# Patient Record
Sex: Male | Born: 1950 | Race: White | Hispanic: No | Marital: Married | Smoking: Never smoker
Health system: Southern US, Community
[De-identification: ages and names within clinical notes are randomized; demographics above are authoritative.]

## PROBLEM LIST (undated history)

## (undated) DIAGNOSIS — G43909 Migraine, unspecified, not intractable, without status migrainosus: Secondary | ICD-10-CM

## (undated) DIAGNOSIS — R7303 Prediabetes: Secondary | ICD-10-CM

## (undated) DIAGNOSIS — I10 Essential (primary) hypertension: Secondary | ICD-10-CM

## (undated) DIAGNOSIS — K5792 Diverticulitis of intestine, part unspecified, without perforation or abscess without bleeding: Secondary | ICD-10-CM

## (undated) HISTORY — DX: Migraine, unspecified, not intractable, without status migrainosus: G43.909

## (undated) HISTORY — DX: Diverticulitis of intestine, part unspecified, without perforation or abscess without bleeding: K57.92

## (undated) HISTORY — DX: Essential (primary) hypertension: I10

## (undated) HISTORY — DX: Prediabetes: R73.03

---

## 1992-07-21 HISTORY — PX: APPENDECTOMY: SHX54

## 2003-07-22 HISTORY — PX: BACK SURGERY: SHX140

## 2014-06-21 ENCOUNTER — Encounter (INDEPENDENT_AMBULATORY_CARE_PROVIDER_SITE_OTHER): Payer: Self-pay | Admitting: Ophthalmology

## 2014-06-22 ENCOUNTER — Encounter (INDEPENDENT_AMBULATORY_CARE_PROVIDER_SITE_OTHER): Payer: Managed Care, Other (non HMO) | Admitting: Ophthalmology

## 2014-06-22 DIAGNOSIS — H3532 Exudative age-related macular degeneration: Secondary | ICD-10-CM

## 2014-06-22 DIAGNOSIS — H43813 Vitreous degeneration, bilateral: Secondary | ICD-10-CM

## 2014-06-22 DIAGNOSIS — H2513 Age-related nuclear cataract, bilateral: Secondary | ICD-10-CM

## 2014-06-22 DIAGNOSIS — H3531 Nonexudative age-related macular degeneration: Secondary | ICD-10-CM

## 2014-06-22 DIAGNOSIS — H35033 Hypertensive retinopathy, bilateral: Secondary | ICD-10-CM

## 2014-06-22 DIAGNOSIS — I10 Essential (primary) hypertension: Secondary | ICD-10-CM

## 2014-07-24 ENCOUNTER — Encounter (INDEPENDENT_AMBULATORY_CARE_PROVIDER_SITE_OTHER): Payer: Managed Care, Other (non HMO) | Admitting: Ophthalmology

## 2014-07-24 DIAGNOSIS — H2513 Age-related nuclear cataract, bilateral: Secondary | ICD-10-CM

## 2014-07-24 DIAGNOSIS — H3532 Exudative age-related macular degeneration: Secondary | ICD-10-CM

## 2014-07-24 DIAGNOSIS — H43813 Vitreous degeneration, bilateral: Secondary | ICD-10-CM

## 2014-07-24 DIAGNOSIS — I10 Essential (primary) hypertension: Secondary | ICD-10-CM

## 2014-07-24 DIAGNOSIS — H3531 Nonexudative age-related macular degeneration: Secondary | ICD-10-CM

## 2014-07-24 DIAGNOSIS — H35033 Hypertensive retinopathy, bilateral: Secondary | ICD-10-CM

## 2014-08-21 ENCOUNTER — Encounter (INDEPENDENT_AMBULATORY_CARE_PROVIDER_SITE_OTHER): Payer: Managed Care, Other (non HMO) | Admitting: Ophthalmology

## 2014-08-21 DIAGNOSIS — H3531 Nonexudative age-related macular degeneration: Secondary | ICD-10-CM

## 2014-08-21 DIAGNOSIS — I10 Essential (primary) hypertension: Secondary | ICD-10-CM

## 2014-08-21 DIAGNOSIS — H2513 Age-related nuclear cataract, bilateral: Secondary | ICD-10-CM

## 2014-08-21 DIAGNOSIS — H35033 Hypertensive retinopathy, bilateral: Secondary | ICD-10-CM

## 2014-08-21 DIAGNOSIS — H3532 Exudative age-related macular degeneration: Secondary | ICD-10-CM

## 2014-08-21 DIAGNOSIS — H43813 Vitreous degeneration, bilateral: Secondary | ICD-10-CM

## 2014-09-19 ENCOUNTER — Encounter (INDEPENDENT_AMBULATORY_CARE_PROVIDER_SITE_OTHER): Payer: Managed Care, Other (non HMO) | Admitting: Ophthalmology

## 2014-09-19 DIAGNOSIS — H35033 Hypertensive retinopathy, bilateral: Secondary | ICD-10-CM | POA: Diagnosis not present

## 2014-09-19 DIAGNOSIS — H3531 Nonexudative age-related macular degeneration: Secondary | ICD-10-CM | POA: Diagnosis not present

## 2014-09-19 DIAGNOSIS — H43813 Vitreous degeneration, bilateral: Secondary | ICD-10-CM | POA: Diagnosis not present

## 2014-09-19 DIAGNOSIS — H3532 Exudative age-related macular degeneration: Secondary | ICD-10-CM | POA: Diagnosis not present

## 2014-09-19 DIAGNOSIS — I10 Essential (primary) hypertension: Secondary | ICD-10-CM | POA: Diagnosis not present

## 2014-10-23 ENCOUNTER — Encounter (INDEPENDENT_AMBULATORY_CARE_PROVIDER_SITE_OTHER): Payer: Managed Care, Other (non HMO) | Admitting: Ophthalmology

## 2014-10-23 DIAGNOSIS — H3531 Nonexudative age-related macular degeneration: Secondary | ICD-10-CM | POA: Diagnosis not present

## 2014-10-23 DIAGNOSIS — H35033 Hypertensive retinopathy, bilateral: Secondary | ICD-10-CM

## 2014-10-23 DIAGNOSIS — H3532 Exudative age-related macular degeneration: Secondary | ICD-10-CM

## 2014-10-23 DIAGNOSIS — I10 Essential (primary) hypertension: Secondary | ICD-10-CM | POA: Diagnosis not present

## 2014-10-23 DIAGNOSIS — H2513 Age-related nuclear cataract, bilateral: Secondary | ICD-10-CM

## 2014-10-23 DIAGNOSIS — H43813 Vitreous degeneration, bilateral: Secondary | ICD-10-CM | POA: Diagnosis not present

## 2014-10-24 ENCOUNTER — Encounter (INDEPENDENT_AMBULATORY_CARE_PROVIDER_SITE_OTHER): Payer: Managed Care, Other (non HMO) | Admitting: Ophthalmology

## 2014-11-28 ENCOUNTER — Encounter (INDEPENDENT_AMBULATORY_CARE_PROVIDER_SITE_OTHER): Payer: Managed Care, Other (non HMO) | Admitting: Ophthalmology

## 2014-11-28 DIAGNOSIS — H3532 Exudative age-related macular degeneration: Secondary | ICD-10-CM

## 2014-11-28 DIAGNOSIS — H35033 Hypertensive retinopathy, bilateral: Secondary | ICD-10-CM | POA: Diagnosis not present

## 2014-11-28 DIAGNOSIS — H3531 Nonexudative age-related macular degeneration: Secondary | ICD-10-CM

## 2014-11-28 DIAGNOSIS — I10 Essential (primary) hypertension: Secondary | ICD-10-CM

## 2014-11-28 DIAGNOSIS — H2513 Age-related nuclear cataract, bilateral: Secondary | ICD-10-CM

## 2014-11-28 DIAGNOSIS — H43813 Vitreous degeneration, bilateral: Secondary | ICD-10-CM | POA: Diagnosis not present

## 2015-01-02 ENCOUNTER — Encounter (INDEPENDENT_AMBULATORY_CARE_PROVIDER_SITE_OTHER): Payer: Managed Care, Other (non HMO) | Admitting: Ophthalmology

## 2015-01-04 ENCOUNTER — Encounter (INDEPENDENT_AMBULATORY_CARE_PROVIDER_SITE_OTHER): Payer: Managed Care, Other (non HMO) | Admitting: Ophthalmology

## 2015-01-04 DIAGNOSIS — H43813 Vitreous degeneration, bilateral: Secondary | ICD-10-CM | POA: Diagnosis not present

## 2015-01-04 DIAGNOSIS — I10 Essential (primary) hypertension: Secondary | ICD-10-CM | POA: Diagnosis not present

## 2015-01-04 DIAGNOSIS — H3531 Nonexudative age-related macular degeneration: Secondary | ICD-10-CM | POA: Diagnosis not present

## 2015-01-04 DIAGNOSIS — H2513 Age-related nuclear cataract, bilateral: Secondary | ICD-10-CM | POA: Diagnosis not present

## 2015-01-04 DIAGNOSIS — H35033 Hypertensive retinopathy, bilateral: Secondary | ICD-10-CM

## 2015-01-04 DIAGNOSIS — H3532 Exudative age-related macular degeneration: Secondary | ICD-10-CM

## 2015-02-13 ENCOUNTER — Encounter (INDEPENDENT_AMBULATORY_CARE_PROVIDER_SITE_OTHER): Payer: Managed Care, Other (non HMO) | Admitting: Ophthalmology

## 2015-02-13 DIAGNOSIS — H3532 Exudative age-related macular degeneration: Secondary | ICD-10-CM

## 2015-02-13 DIAGNOSIS — H35033 Hypertensive retinopathy, bilateral: Secondary | ICD-10-CM | POA: Diagnosis not present

## 2015-02-13 DIAGNOSIS — I10 Essential (primary) hypertension: Secondary | ICD-10-CM

## 2015-02-13 DIAGNOSIS — H43813 Vitreous degeneration, bilateral: Secondary | ICD-10-CM | POA: Diagnosis not present

## 2015-02-13 DIAGNOSIS — H3531 Nonexudative age-related macular degeneration: Secondary | ICD-10-CM | POA: Diagnosis not present

## 2015-03-27 ENCOUNTER — Encounter (INDEPENDENT_AMBULATORY_CARE_PROVIDER_SITE_OTHER): Payer: Managed Care, Other (non HMO) | Admitting: Ophthalmology

## 2015-03-27 DIAGNOSIS — H3532 Exudative age-related macular degeneration: Secondary | ICD-10-CM | POA: Diagnosis not present

## 2015-03-27 DIAGNOSIS — I10 Essential (primary) hypertension: Secondary | ICD-10-CM | POA: Diagnosis not present

## 2015-03-27 DIAGNOSIS — H3531 Nonexudative age-related macular degeneration: Secondary | ICD-10-CM | POA: Diagnosis not present

## 2015-03-27 DIAGNOSIS — H35033 Hypertensive retinopathy, bilateral: Secondary | ICD-10-CM | POA: Diagnosis not present

## 2015-03-27 DIAGNOSIS — H43813 Vitreous degeneration, bilateral: Secondary | ICD-10-CM

## 2015-03-27 DIAGNOSIS — H2513 Age-related nuclear cataract, bilateral: Secondary | ICD-10-CM | POA: Diagnosis not present

## 2015-05-15 ENCOUNTER — Encounter (INDEPENDENT_AMBULATORY_CARE_PROVIDER_SITE_OTHER): Payer: Managed Care, Other (non HMO) | Admitting: Ophthalmology

## 2015-05-15 DIAGNOSIS — H353211 Exudative age-related macular degeneration, right eye, with active choroidal neovascularization: Secondary | ICD-10-CM

## 2015-05-15 DIAGNOSIS — H353121 Nonexudative age-related macular degeneration, left eye, early dry stage: Secondary | ICD-10-CM

## 2015-05-15 DIAGNOSIS — H35033 Hypertensive retinopathy, bilateral: Secondary | ICD-10-CM

## 2015-05-15 DIAGNOSIS — H43813 Vitreous degeneration, bilateral: Secondary | ICD-10-CM

## 2015-05-15 DIAGNOSIS — I10 Essential (primary) hypertension: Secondary | ICD-10-CM

## 2015-07-03 ENCOUNTER — Encounter (INDEPENDENT_AMBULATORY_CARE_PROVIDER_SITE_OTHER): Payer: Managed Care, Other (non HMO) | Admitting: Ophthalmology

## 2015-07-03 DIAGNOSIS — H353211 Exudative age-related macular degeneration, right eye, with active choroidal neovascularization: Secondary | ICD-10-CM

## 2015-07-03 DIAGNOSIS — H353121 Nonexudative age-related macular degeneration, left eye, early dry stage: Secondary | ICD-10-CM | POA: Diagnosis not present

## 2015-07-03 DIAGNOSIS — H35033 Hypertensive retinopathy, bilateral: Secondary | ICD-10-CM | POA: Diagnosis not present

## 2015-07-03 DIAGNOSIS — H43813 Vitreous degeneration, bilateral: Secondary | ICD-10-CM | POA: Diagnosis not present

## 2015-07-03 DIAGNOSIS — I10 Essential (primary) hypertension: Secondary | ICD-10-CM

## 2015-07-03 DIAGNOSIS — H2513 Age-related nuclear cataract, bilateral: Secondary | ICD-10-CM

## 2015-08-21 ENCOUNTER — Encounter (INDEPENDENT_AMBULATORY_CARE_PROVIDER_SITE_OTHER): Payer: BLUE CROSS/BLUE SHIELD | Admitting: Ophthalmology

## 2015-08-21 DIAGNOSIS — H35033 Hypertensive retinopathy, bilateral: Secondary | ICD-10-CM

## 2015-08-21 DIAGNOSIS — H353211 Exudative age-related macular degeneration, right eye, with active choroidal neovascularization: Secondary | ICD-10-CM

## 2015-08-21 DIAGNOSIS — H43813 Vitreous degeneration, bilateral: Secondary | ICD-10-CM

## 2015-08-21 DIAGNOSIS — I10 Essential (primary) hypertension: Secondary | ICD-10-CM

## 2015-08-21 DIAGNOSIS — H353121 Nonexudative age-related macular degeneration, left eye, early dry stage: Secondary | ICD-10-CM | POA: Diagnosis not present

## 2015-10-16 ENCOUNTER — Encounter (INDEPENDENT_AMBULATORY_CARE_PROVIDER_SITE_OTHER): Payer: BLUE CROSS/BLUE SHIELD | Admitting: Ophthalmology

## 2015-10-16 DIAGNOSIS — H2513 Age-related nuclear cataract, bilateral: Secondary | ICD-10-CM

## 2015-10-16 DIAGNOSIS — H353121 Nonexudative age-related macular degeneration, left eye, early dry stage: Secondary | ICD-10-CM | POA: Diagnosis not present

## 2015-10-16 DIAGNOSIS — H353211 Exudative age-related macular degeneration, right eye, with active choroidal neovascularization: Secondary | ICD-10-CM | POA: Diagnosis not present

## 2015-10-16 DIAGNOSIS — I10 Essential (primary) hypertension: Secondary | ICD-10-CM | POA: Diagnosis not present

## 2015-10-16 DIAGNOSIS — H43813 Vitreous degeneration, bilateral: Secondary | ICD-10-CM | POA: Diagnosis not present

## 2015-10-16 DIAGNOSIS — H35033 Hypertensive retinopathy, bilateral: Secondary | ICD-10-CM | POA: Diagnosis not present

## 2015-12-18 ENCOUNTER — Encounter (INDEPENDENT_AMBULATORY_CARE_PROVIDER_SITE_OTHER): Payer: BLUE CROSS/BLUE SHIELD | Admitting: Ophthalmology

## 2015-12-18 DIAGNOSIS — H2513 Age-related nuclear cataract, bilateral: Secondary | ICD-10-CM

## 2015-12-18 DIAGNOSIS — H35033 Hypertensive retinopathy, bilateral: Secondary | ICD-10-CM | POA: Diagnosis not present

## 2015-12-18 DIAGNOSIS — H43813 Vitreous degeneration, bilateral: Secondary | ICD-10-CM

## 2015-12-18 DIAGNOSIS — H353121 Nonexudative age-related macular degeneration, left eye, early dry stage: Secondary | ICD-10-CM

## 2015-12-18 DIAGNOSIS — I10 Essential (primary) hypertension: Secondary | ICD-10-CM

## 2015-12-18 DIAGNOSIS — H353211 Exudative age-related macular degeneration, right eye, with active choroidal neovascularization: Secondary | ICD-10-CM

## 2016-02-26 ENCOUNTER — Encounter (INDEPENDENT_AMBULATORY_CARE_PROVIDER_SITE_OTHER): Payer: BLUE CROSS/BLUE SHIELD | Admitting: Ophthalmology

## 2016-02-26 DIAGNOSIS — H35033 Hypertensive retinopathy, bilateral: Secondary | ICD-10-CM | POA: Diagnosis not present

## 2016-02-26 DIAGNOSIS — H43813 Vitreous degeneration, bilateral: Secondary | ICD-10-CM | POA: Diagnosis not present

## 2016-02-26 DIAGNOSIS — H353121 Nonexudative age-related macular degeneration, left eye, early dry stage: Secondary | ICD-10-CM | POA: Diagnosis not present

## 2016-02-26 DIAGNOSIS — H353211 Exudative age-related macular degeneration, right eye, with active choroidal neovascularization: Secondary | ICD-10-CM | POA: Diagnosis not present

## 2016-02-26 DIAGNOSIS — I10 Essential (primary) hypertension: Secondary | ICD-10-CM

## 2016-05-20 ENCOUNTER — Encounter (INDEPENDENT_AMBULATORY_CARE_PROVIDER_SITE_OTHER): Payer: BLUE CROSS/BLUE SHIELD | Admitting: Ophthalmology

## 2016-05-20 DIAGNOSIS — I10 Essential (primary) hypertension: Secondary | ICD-10-CM | POA: Diagnosis not present

## 2016-05-20 DIAGNOSIS — H35033 Hypertensive retinopathy, bilateral: Secondary | ICD-10-CM | POA: Diagnosis not present

## 2016-05-20 DIAGNOSIS — H353211 Exudative age-related macular degeneration, right eye, with active choroidal neovascularization: Secondary | ICD-10-CM

## 2016-05-20 DIAGNOSIS — H2513 Age-related nuclear cataract, bilateral: Secondary | ICD-10-CM

## 2016-05-20 DIAGNOSIS — H353121 Nonexudative age-related macular degeneration, left eye, early dry stage: Secondary | ICD-10-CM

## 2016-05-20 DIAGNOSIS — H43813 Vitreous degeneration, bilateral: Secondary | ICD-10-CM

## 2016-08-26 ENCOUNTER — Encounter (INDEPENDENT_AMBULATORY_CARE_PROVIDER_SITE_OTHER): Payer: BLUE CROSS/BLUE SHIELD | Admitting: Ophthalmology

## 2016-08-26 DIAGNOSIS — H353121 Nonexudative age-related macular degeneration, left eye, early dry stage: Secondary | ICD-10-CM

## 2016-08-26 DIAGNOSIS — H35033 Hypertensive retinopathy, bilateral: Secondary | ICD-10-CM | POA: Diagnosis not present

## 2016-08-26 DIAGNOSIS — H353211 Exudative age-related macular degeneration, right eye, with active choroidal neovascularization: Secondary | ICD-10-CM | POA: Diagnosis not present

## 2016-08-26 DIAGNOSIS — I10 Essential (primary) hypertension: Secondary | ICD-10-CM | POA: Diagnosis not present

## 2016-08-26 DIAGNOSIS — H43813 Vitreous degeneration, bilateral: Secondary | ICD-10-CM

## 2016-12-02 ENCOUNTER — Encounter (INDEPENDENT_AMBULATORY_CARE_PROVIDER_SITE_OTHER): Payer: BLUE CROSS/BLUE SHIELD | Admitting: Ophthalmology

## 2016-12-10 ENCOUNTER — Encounter (INDEPENDENT_AMBULATORY_CARE_PROVIDER_SITE_OTHER): Payer: BLUE CROSS/BLUE SHIELD | Admitting: Ophthalmology

## 2016-12-10 DIAGNOSIS — H353211 Exudative age-related macular degeneration, right eye, with active choroidal neovascularization: Secondary | ICD-10-CM

## 2016-12-10 DIAGNOSIS — H43813 Vitreous degeneration, bilateral: Secondary | ICD-10-CM | POA: Diagnosis not present

## 2016-12-10 DIAGNOSIS — H2513 Age-related nuclear cataract, bilateral: Secondary | ICD-10-CM

## 2016-12-10 DIAGNOSIS — H35033 Hypertensive retinopathy, bilateral: Secondary | ICD-10-CM | POA: Diagnosis not present

## 2016-12-10 DIAGNOSIS — I10 Essential (primary) hypertension: Secondary | ICD-10-CM | POA: Diagnosis not present

## 2016-12-10 DIAGNOSIS — H53121 Transient visual loss, right eye: Secondary | ICD-10-CM

## 2017-03-17 ENCOUNTER — Encounter (INDEPENDENT_AMBULATORY_CARE_PROVIDER_SITE_OTHER): Payer: BLUE CROSS/BLUE SHIELD | Admitting: Ophthalmology

## 2017-03-24 ENCOUNTER — Encounter (INDEPENDENT_AMBULATORY_CARE_PROVIDER_SITE_OTHER): Payer: BLUE CROSS/BLUE SHIELD | Admitting: Ophthalmology

## 2017-03-24 DIAGNOSIS — H353211 Exudative age-related macular degeneration, right eye, with active choroidal neovascularization: Secondary | ICD-10-CM | POA: Diagnosis not present

## 2017-03-24 DIAGNOSIS — I10 Essential (primary) hypertension: Secondary | ICD-10-CM | POA: Diagnosis not present

## 2017-03-24 DIAGNOSIS — H35033 Hypertensive retinopathy, bilateral: Secondary | ICD-10-CM

## 2017-03-24 DIAGNOSIS — H353121 Nonexudative age-related macular degeneration, left eye, early dry stage: Secondary | ICD-10-CM

## 2017-03-24 DIAGNOSIS — H43813 Vitreous degeneration, bilateral: Secondary | ICD-10-CM

## 2017-06-26 ENCOUNTER — Encounter (INDEPENDENT_AMBULATORY_CARE_PROVIDER_SITE_OTHER): Payer: BLUE CROSS/BLUE SHIELD | Admitting: Ophthalmology

## 2017-06-26 DIAGNOSIS — H2513 Age-related nuclear cataract, bilateral: Secondary | ICD-10-CM | POA: Diagnosis not present

## 2017-06-26 DIAGNOSIS — I10 Essential (primary) hypertension: Secondary | ICD-10-CM

## 2017-06-26 DIAGNOSIS — H35033 Hypertensive retinopathy, bilateral: Secondary | ICD-10-CM | POA: Diagnosis not present

## 2017-06-26 DIAGNOSIS — H43813 Vitreous degeneration, bilateral: Secondary | ICD-10-CM

## 2017-06-26 DIAGNOSIS — H353121 Nonexudative age-related macular degeneration, left eye, early dry stage: Secondary | ICD-10-CM

## 2017-06-26 DIAGNOSIS — H353211 Exudative age-related macular degeneration, right eye, with active choroidal neovascularization: Secondary | ICD-10-CM

## 2017-06-30 ENCOUNTER — Encounter (INDEPENDENT_AMBULATORY_CARE_PROVIDER_SITE_OTHER): Payer: BLUE CROSS/BLUE SHIELD | Admitting: Ophthalmology

## 2017-10-13 ENCOUNTER — Encounter (INDEPENDENT_AMBULATORY_CARE_PROVIDER_SITE_OTHER): Payer: BLUE CROSS/BLUE SHIELD | Admitting: Ophthalmology

## 2017-10-13 DIAGNOSIS — H353121 Nonexudative age-related macular degeneration, left eye, early dry stage: Secondary | ICD-10-CM

## 2017-10-13 DIAGNOSIS — H353211 Exudative age-related macular degeneration, right eye, with active choroidal neovascularization: Secondary | ICD-10-CM

## 2017-10-13 DIAGNOSIS — I10 Essential (primary) hypertension: Secondary | ICD-10-CM

## 2017-10-13 DIAGNOSIS — H43813 Vitreous degeneration, bilateral: Secondary | ICD-10-CM | POA: Diagnosis not present

## 2017-10-13 DIAGNOSIS — H2513 Age-related nuclear cataract, bilateral: Secondary | ICD-10-CM

## 2017-10-13 DIAGNOSIS — H35033 Hypertensive retinopathy, bilateral: Secondary | ICD-10-CM | POA: Diagnosis not present

## 2018-02-02 ENCOUNTER — Encounter (INDEPENDENT_AMBULATORY_CARE_PROVIDER_SITE_OTHER): Payer: BLUE CROSS/BLUE SHIELD | Admitting: Ophthalmology

## 2018-02-02 DIAGNOSIS — H353211 Exudative age-related macular degeneration, right eye, with active choroidal neovascularization: Secondary | ICD-10-CM

## 2018-02-02 DIAGNOSIS — I10 Essential (primary) hypertension: Secondary | ICD-10-CM

## 2018-02-02 DIAGNOSIS — H353121 Nonexudative age-related macular degeneration, left eye, early dry stage: Secondary | ICD-10-CM

## 2018-02-02 DIAGNOSIS — H43813 Vitreous degeneration, bilateral: Secondary | ICD-10-CM

## 2018-02-02 DIAGNOSIS — H2513 Age-related nuclear cataract, bilateral: Secondary | ICD-10-CM

## 2018-02-02 DIAGNOSIS — H35033 Hypertensive retinopathy, bilateral: Secondary | ICD-10-CM

## 2018-05-10 DIAGNOSIS — Z23 Encounter for immunization: Secondary | ICD-10-CM | POA: Diagnosis not present

## 2018-06-01 ENCOUNTER — Encounter (INDEPENDENT_AMBULATORY_CARE_PROVIDER_SITE_OTHER): Payer: Medicare Other | Admitting: Ophthalmology

## 2018-06-01 DIAGNOSIS — H2513 Age-related nuclear cataract, bilateral: Secondary | ICD-10-CM | POA: Diagnosis not present

## 2018-06-01 DIAGNOSIS — H353112 Nonexudative age-related macular degeneration, right eye, intermediate dry stage: Secondary | ICD-10-CM | POA: Diagnosis not present

## 2018-06-01 DIAGNOSIS — I10 Essential (primary) hypertension: Secondary | ICD-10-CM | POA: Diagnosis not present

## 2018-06-01 DIAGNOSIS — H43813 Vitreous degeneration, bilateral: Secondary | ICD-10-CM | POA: Diagnosis not present

## 2018-06-01 DIAGNOSIS — H35033 Hypertensive retinopathy, bilateral: Secondary | ICD-10-CM | POA: Diagnosis not present

## 2018-06-01 DIAGNOSIS — H353211 Exudative age-related macular degeneration, right eye, with active choroidal neovascularization: Secondary | ICD-10-CM | POA: Diagnosis not present

## 2018-09-08 ENCOUNTER — Telehealth: Payer: Self-pay | Admitting: *Deleted

## 2018-09-08 NOTE — Telephone Encounter (Signed)
Received Medical records from [facility]; forwarded to provider/SLS 02/19

## 2018-09-10 ENCOUNTER — Encounter: Payer: Self-pay | Admitting: Family Medicine

## 2018-09-10 ENCOUNTER — Ambulatory Visit (INDEPENDENT_AMBULATORY_CARE_PROVIDER_SITE_OTHER): Payer: Medicare Other | Admitting: Family Medicine

## 2018-09-10 VITALS — BP 150/90 | HR 94 | Temp 98.5°F | Ht 68.0 in | Wt 230.4 lb

## 2018-09-10 DIAGNOSIS — Z23 Encounter for immunization: Secondary | ICD-10-CM

## 2018-09-10 DIAGNOSIS — I10 Essential (primary) hypertension: Secondary | ICD-10-CM | POA: Insufficient documentation

## 2018-09-10 MED ORDER — LOSARTAN POTASSIUM-HCTZ 100-25 MG PO TABS: 1.0000 | ORAL_TABLET | Freq: Every day | ORAL | 1 refills | Status: DC

## 2018-09-10 NOTE — Progress Notes (Signed)
Chief Complaint  Patient presents with  . New Patient (Initial Visit)       New Patient Visit SUBJECTIVE: HPI: Joseph Mayer is an 68 y.o.male who is being seen for establishing care.  The patient was previously seen at office in [place].  Hypertension Patient presents for hypertension follow up. He does monitor home blood pressures. Blood pressures ranging on average from 120's/70's. He is compliant with medications (normally)- Hyzaar 100-25 mg/d. Patient has these side effects of medication: none He is adhering to a healthy diet overall. Exercise: some walking, lifting weights  No Known Allergies  Past Medical History:  Diagnosis Date  . Diverticulitis   . Hypertension   . Migraine    Past Surgical History:  Procedure Laterality Date  . APPENDECTOMY  1994  . BACK SURGERY  2005   Family History  Problem Relation Age of Onset  . Diabetes Mother   . Early death Mother   . Heart disease Mother   . Stroke Mother   . Early death Maternal Grandmother   . Arthritis Maternal Grandfather        RA  . Early death Paternal Grandfather    No Known Allergies  Current Outpatient Medications:  .  losartan-hydrochlorothiazide (HYZAAR) 100-25 MG tablet, Take 1 tablet by mouth daily., Disp: 90 tablet, Rfl: 1 .  Multiple Vitamins-Minerals (PRESERVISION AREDS 2 PO), Take by mouth., Disp: , Rfl:    ROS Cardiovascular: Denies chest pain  Respiratory: Denies dyspnea   OBJECTIVE: BP (!) 150/90 (BP Location: Left Arm, Patient Position: Sitting, Cuff Size: Large)   Pulse 94   Temp 98.5 F (36.9 C) (Oral)   Ht 5\' 8"  (1.727 m)   Wt 230 lb 6 oz (104.5 kg)   SpO2 96%   BMI 35.03 kg/m   Constitutional: -  VS reviewed -  Well developed, well nourished, appears stated age -  No apparent distress  Psychiatric: -  Oriented to person, place, and time -  Memory intact -  Affect and mood normal -  Fluent conversation, good eye contact -  Judgment and insight age appropriate   Eye: -  Conjunctivae clear, no discharge -  Pupils symmetric, round, reactive to light  ENMT: -  MMM    Pharynx moist, no exudate, no erythema  Neck: -  No gross swelling, no palpable masses -  Thyroid midline, not enlarged, mobile, no palpable masses  Cardiovascular: -  RRR -  No LE edema  Respiratory: -  Normal respiratory effort, no accessory muscle use, no retraction -  Breath sounds equal, no wheezes, no ronchi, no crackles  Musculoskeletal: -  No clubbing, no cyanosis -  Gait normal  Skin: -  No significant lesion on inspection -  Warm and dry to palpation   ASSESSMENT/PLAN: Essential hypertension - Plan: losartan-hydrochlorothiazide (HYZAAR) 100-25 MG tablet  Need for vaccination against Streptococcus pneumoniae - Plan: Pneumococcal polysaccharide vaccine 23-valent greater than or equal to 2yo subcutaneous/IM  Patient instructed to sign release of records form from her previous PCP. Get back on medicine. If normal readings don't return, he will let us know. Counseled on diet and exercise. Patient should return in 6 mo for Welcome to Medicare visit. . The patient voiced understanding and agreement to the plan.   [place], DO 09/10/18  1:37 PM

## 2018-09-10 NOTE — Patient Instructions (Addendum)
Keep the diet clean and stay active.  Let me know if your BP's do not get back to normal levels.  Let us know if you need anything.

## 2018-09-28 ENCOUNTER — Encounter (INDEPENDENT_AMBULATORY_CARE_PROVIDER_SITE_OTHER): Payer: Medicare Other | Admitting: Ophthalmology

## 2018-09-28 DIAGNOSIS — H35033 Hypertensive retinopathy, bilateral: Secondary | ICD-10-CM | POA: Diagnosis not present

## 2018-09-28 DIAGNOSIS — I10 Essential (primary) hypertension: Secondary | ICD-10-CM

## 2018-09-28 DIAGNOSIS — H43813 Vitreous degeneration, bilateral: Secondary | ICD-10-CM

## 2018-09-28 DIAGNOSIS — H2513 Age-related nuclear cataract, bilateral: Secondary | ICD-10-CM | POA: Diagnosis not present

## 2018-09-28 DIAGNOSIS — H353211 Exudative age-related macular degeneration, right eye, with active choroidal neovascularization: Secondary | ICD-10-CM

## 2018-09-28 DIAGNOSIS — H353121 Nonexudative age-related macular degeneration, left eye, early dry stage: Secondary | ICD-10-CM

## 2019-01-25 ENCOUNTER — Other Ambulatory Visit: Payer: Self-pay

## 2019-01-25 ENCOUNTER — Encounter (INDEPENDENT_AMBULATORY_CARE_PROVIDER_SITE_OTHER): Payer: Medicare Other | Admitting: Ophthalmology

## 2019-01-25 DIAGNOSIS — H2513 Age-related nuclear cataract, bilateral: Secondary | ICD-10-CM

## 2019-01-25 DIAGNOSIS — H35033 Hypertensive retinopathy, bilateral: Secondary | ICD-10-CM | POA: Diagnosis not present

## 2019-01-25 DIAGNOSIS — I10 Essential (primary) hypertension: Secondary | ICD-10-CM

## 2019-01-25 DIAGNOSIS — H353121 Nonexudative age-related macular degeneration, left eye, early dry stage: Secondary | ICD-10-CM

## 2019-01-25 DIAGNOSIS — H353211 Exudative age-related macular degeneration, right eye, with active choroidal neovascularization: Secondary | ICD-10-CM

## 2019-01-25 DIAGNOSIS — H43813 Vitreous degeneration, bilateral: Secondary | ICD-10-CM

## 2019-02-07 ENCOUNTER — Telehealth: Payer: Self-pay | Admitting: Family Medicine

## 2019-02-07 DIAGNOSIS — I10 Essential (primary) hypertension: Secondary | ICD-10-CM

## 2019-02-07 MED ORDER — LOSARTAN POTASSIUM-HCTZ 100-25 MG PO TABS: 1.0000 | ORAL_TABLET | Freq: Every day | ORAL | 1 refills | Status: DC

## 2019-02-07 NOTE — Telephone Encounter (Signed)
Sent in//tried to call patient number did not work

## 2019-02-07 NOTE — Telephone Encounter (Signed)
Pt has a couple weeks of losartan-hydrochlorothiazide (HYZAAR) 100-25 MG tablet. He is going to visit his daughter out of state. He is asking for another 90 day supply. He has Welcome to [facility] [place] with Dr. Nani Ravens 06/01/2019. Please advise.  [id number] [address], [place] [phone] (Phone) [phone] (Fax)

## 2019-02-24 ENCOUNTER — Ambulatory Visit: Payer: Medicare Other | Admitting: *Deleted

## 2019-05-31 ENCOUNTER — Encounter (INDEPENDENT_AMBULATORY_CARE_PROVIDER_SITE_OTHER): Payer: Medicare Other | Admitting: Ophthalmology

## 2019-05-31 ENCOUNTER — Other Ambulatory Visit: Payer: Self-pay

## 2019-05-31 DIAGNOSIS — H43813 Vitreous degeneration, bilateral: Secondary | ICD-10-CM | POA: Diagnosis not present

## 2019-05-31 DIAGNOSIS — H353211 Exudative age-related macular degeneration, right eye, with active choroidal neovascularization: Secondary | ICD-10-CM | POA: Diagnosis not present

## 2019-05-31 DIAGNOSIS — H2513 Age-related nuclear cataract, bilateral: Secondary | ICD-10-CM

## 2019-05-31 DIAGNOSIS — H35033 Hypertensive retinopathy, bilateral: Secondary | ICD-10-CM | POA: Diagnosis not present

## 2019-05-31 DIAGNOSIS — H33301 Unspecified retinal break, right eye: Secondary | ICD-10-CM | POA: Diagnosis not present

## 2019-05-31 DIAGNOSIS — H353121 Nonexudative age-related macular degeneration, left eye, early dry stage: Secondary | ICD-10-CM

## 2019-05-31 DIAGNOSIS — I10 Essential (primary) hypertension: Secondary | ICD-10-CM

## 2019-06-01 ENCOUNTER — Encounter: Payer: Self-pay | Admitting: Family Medicine

## 2019-06-01 ENCOUNTER — Other Ambulatory Visit: Payer: Self-pay

## 2019-06-01 ENCOUNTER — Ambulatory Visit (INDEPENDENT_AMBULATORY_CARE_PROVIDER_SITE_OTHER): Payer: Medicare Other | Admitting: Family Medicine

## 2019-06-01 ENCOUNTER — Other Ambulatory Visit: Payer: Self-pay | Admitting: Family Medicine

## 2019-06-01 VITALS — BP 140/84 | HR 99 | Temp 95.5°F | Ht 68.0 in | Wt 229.4 lb

## 2019-06-01 DIAGNOSIS — E669 Obesity, unspecified: Secondary | ICD-10-CM

## 2019-06-01 DIAGNOSIS — Z125 Encounter for screening for malignant neoplasm of prostate: Secondary | ICD-10-CM | POA: Diagnosis not present

## 2019-06-01 DIAGNOSIS — I10 Essential (primary) hypertension: Secondary | ICD-10-CM | POA: Diagnosis not present

## 2019-06-01 DIAGNOSIS — Z23 Encounter for immunization: Secondary | ICD-10-CM

## 2019-06-01 DIAGNOSIS — D3611 Benign neoplasm of peripheral nerves and autonomic nervous system of face, head, and neck: Secondary | ICD-10-CM | POA: Diagnosis not present

## 2019-06-01 DIAGNOSIS — Z0000 Encounter for general adult medical examination without abnormal findings: Secondary | ICD-10-CM

## 2019-06-01 DIAGNOSIS — D489 Neoplasm of uncertain behavior, unspecified: Secondary | ICD-10-CM | POA: Diagnosis not present

## 2019-06-01 LAB — COMPREHENSIVE METABOLIC PANEL
ALT: 21 U/L (ref 0–53)
AST: 23 U/L (ref 0–37)
Albumin: 4.4 g/dL (ref 3.5–5.2)
Alkaline Phosphatase: 60 U/L (ref 39–117)
BUN: 23 mg/dL (ref 6–23)
CO2: 33 mEq/L — ABNORMAL HIGH (ref 19–32)
Calcium: 9.6 mg/dL (ref 8.4–10.5)
Chloride: 97 mEq/L (ref 96–112)
Creatinine, Ser: 1.12 mg/dL (ref 0.40–1.50)
GFR: 65.56 mL/min (ref >60.00)
Glucose, Bld: 113 mg/dL — ABNORMAL HIGH (ref 70–99)
Potassium: 4.2 mEq/L (ref 3.5–5.1)
Sodium: 137 mEq/L (ref 135–145)
Total Bilirubin: 0.8 mg/dL (ref 0.2–1.2)
Total Protein: 7.5 g/dL (ref 6.0–8.3)

## 2019-06-01 LAB — PSA, MEDICARE: PSA: 3.45 ng/ml (ref 0.10–4.00)

## 2019-06-01 LAB — LIPID PANEL
Cholesterol: 218 mg/dL — ABNORMAL HIGH (ref 0–200)
HDL: 56.8 mg/dL (ref >39.00)
LDL Cholesterol: 138 mg/dL — ABNORMAL HIGH (ref 0–99)
NonHDL: 161.57
Total CHOL/HDL Ratio: 4
Triglycerides: 116 mg/dL (ref 0.0–149.0)
VLDL: 23.2 mg/dL (ref 0.0–40.0)

## 2019-06-01 MED ORDER — LOSARTAN POTASSIUM-HCTZ 100-25 MG PO TABS: 1.0000 | ORAL_TABLET | Freq: Every day | ORAL | 3 refills | Status: DC

## 2019-06-01 NOTE — Patient Instructions (Addendum)
Give [place] 2-3 business days to get the results of your labs back.   Keep the diet clean and stay active.  Do not shower for the rest of the day. When you do wash it, use only soap and water. Do not vigorously scrub. Apply triple antibiotic ointment (like Neosporin) twice daily. Keep the area clean and dry.   Things to look out for: increasing pain not relieved by ibuprofen/acetaminophen, fevers, spreading redness, drainage of pus, or foul odor.  Give [place] a business week to get the results of your biopsy back.   Let us know if you need anything.

## 2019-06-01 NOTE — Addendum Note (Signed)
Addended by: Ames Coupe on: 06/01/2019 09:12 AM   Modules accepted: Orders

## 2019-06-01 NOTE — Progress Notes (Addendum)
Chief Complaint  Patient presents with  . Welcome to Medicare    Subjective: Pt here for initial Welcome to Medicare Evaluation.  Pt has had a painful lesion on his neck for the past several years. More bothersome. Not growing, red, or draining. Has not tried anything at home so far.  Hypertension Patient presents for hypertension follow up. He does monitor home blood pressures. He is compliant with medication- Hyzaar 100-25 mg/d. Patient has these side effects of medication: none He is not usually adhering to a healthy diet overall. Exercise: some walking  Past Medical History:  Diagnosis Date  . Diverticulitis   . Hypertension   . Migraine    Family History  Problem Relation Age of Onset  . Diabetes Mother   . Early death Mother   . Heart disease Mother   . Stroke Mother   . Early death Maternal Grandmother   . Arthritis Maternal Grandfather        RA  . Early death Paternal Grandfather    Past Surgical History:  Procedure Laterality Date  . APPENDECTOMY  1994  . BACK SURGERY  2005   Current Outpatient Medications on File Prior to Visit  Medication Sig Dispense Refill  . losartan-hydrochlorothiazide (HYZAAR) 100-25 MG tablet Take 1 tablet by mouth daily. 90 tablet 1  . Multiple Vitamins-Minerals (PRESERVISION AREDS 2 PO) Take by mouth.     No Known Allergies  Females: Smoking, alcohol/drugs, sunscreen  Mental Health/Substance abuse evaluation: Yes, no concerns PHQ-2:  Feelings of depression? No  Loss of satisfaction/pleasure in doing things? No   Fall Risk: Less than 2 falls within the past 12 months? Yes  Mindful of grabbing bars in bathroom, ruffles in rugs, poorly lit areas, handrails on the stairs? Yes   Discussion of functional ability done: Encouraged to maintain physical activity and flexibility. Live alone? No  Need help with the following? Bathing: No  Managing money: No  Taking medications: No  Telephone use: No  Transportation: No   Shopping: No  Preparing meals: No   Hearing/Vision screen: Trouble hearing TV or radio when others do not? No  Straining or struggling to hear/understand conversation? No   Fire safety: Have a working smoke alarm? Yes   Diet Balanced diet? Yes  3 meals daily? usually Assistance needed? No   10 pt ROS neg w exception of skin lesion as noted above.   BP 140/84 (BP Location: Left Arm, Patient Position: Sitting, Cuff Size: Normal)   Pulse 99   Temp (!) 95.5 F (35.3 C) (Temporal)   Ht _0  (1.727 m)   Wt 229 lb 6 oz (104 kg)   SpO2 97%   BMI 34.88 kg/m  Gen: awake, alert Heart: RRR, no bruits, no LE edema Lungs: CTAB, no access msce use GI: BS+, S, NT, ND Skin: +flesh colored and raised lesion on post neck on L measuring 0.6 cm in diameter. No scaling, erythema, drainage; mild ttp Psych: Age appropriate judgment and insight Neuro: DTRs equal and symmetric MSK: No atrophy, 5/5 strength throughout Eyes: PERRLA Nose: Patent w/o dc Ears: Patent, TM's nml Mouth: MMM, no exudates/erythema Neck: Supple, no gross masses  Procedure note; shave biopsy Informed consent was obtained. The area was cleaned with alcohol and injected with 0.75 mL of 1% lidocaine with epinephrine. A Dermablade was slightly bent and used to cut under the area of interest. The specimen was placed in a sterile specimen cup and sent to the lab. The area was then cauterized  ensuring adequate hemostasis. The area was dressed with triple antibiotic ointment and a bandage. There were no complications noted. The patient tolerated the procedure well.  End of life care planning/counseling: Does patient wish to discuss end of life care/planning? Yes  Does the patient have an advanced directive? No  Patient code status/living will: Full code Forms given? Yes   Assessment and Plan Medicare annual wellness visit, initial  Need for influenza vaccination - Plan: Flu Vaccine QUAD High Dose(Fluad)  Essential  hypertension - Plan: losartan-hydrochlorothiazide (HYZAAR) 100-25 MG tablet, Comp Met (CMET)  Obesity (BMI 30.0-34.9) - Plan: Lipid Profile  Screening for prostate cancer - Plan: PSA, Medicare ( [place] only)  Neoplasm of uncertain behavior - Plan: PR SHAV SKIN LES 0.6-1.0 CM TRUNK,ARM,LEG  Colonoscopy- Written plan in AVS for preventative health services. Immunizations UTD Counseled on risks/benefits of prostate ca screening w PSA. He agrees to undergo testing. Removed skin lesion on neck. Warning signs and symptoms verbalized and written down in AVS.  Aftercare instructions also discussed. BP controlled for age. Cont meds. Counseled on diet and exercise.  F/u in 1 year for med ck w me and AWV w nursing team or prn. The patient voiced understanding and agreement to the plan.  [place], DO 06/01/19 9:12 AM

## 2019-06-01 NOTE — Addendum Note (Signed)
Addended by: Sharon Seller B on: 06/01/2019 09:20 AM   Modules accepted: Orders

## 2019-06-08 ENCOUNTER — Other Ambulatory Visit: Payer: Self-pay

## 2019-06-08 ENCOUNTER — Encounter (INDEPENDENT_AMBULATORY_CARE_PROVIDER_SITE_OTHER): Payer: Medicare Other | Admitting: Ophthalmology

## 2019-06-08 DIAGNOSIS — H353211 Exudative age-related macular degeneration, right eye, with active choroidal neovascularization: Secondary | ICD-10-CM

## 2019-06-08 DIAGNOSIS — H33301 Unspecified retinal break, right eye: Secondary | ICD-10-CM

## 2019-10-04 ENCOUNTER — Encounter (INDEPENDENT_AMBULATORY_CARE_PROVIDER_SITE_OTHER): Payer: Medicare Other | Admitting: Ophthalmology

## 2019-10-04 DIAGNOSIS — I10 Essential (primary) hypertension: Secondary | ICD-10-CM

## 2019-10-04 DIAGNOSIS — H353211 Exudative age-related macular degeneration, right eye, with active choroidal neovascularization: Secondary | ICD-10-CM | POA: Diagnosis not present

## 2019-10-04 DIAGNOSIS — H35033 Hypertensive retinopathy, bilateral: Secondary | ICD-10-CM

## 2019-10-04 DIAGNOSIS — H2513 Age-related nuclear cataract, bilateral: Secondary | ICD-10-CM | POA: Diagnosis not present

## 2019-10-04 DIAGNOSIS — H33301 Unspecified retinal break, right eye: Secondary | ICD-10-CM

## 2019-10-04 DIAGNOSIS — H353121 Nonexudative age-related macular degeneration, left eye, early dry stage: Secondary | ICD-10-CM

## 2019-10-04 DIAGNOSIS — H43813 Vitreous degeneration, bilateral: Secondary | ICD-10-CM

## 2019-12-30 ENCOUNTER — Other Ambulatory Visit: Payer: Self-pay

## 2019-12-30 ENCOUNTER — Encounter: Payer: Self-pay | Admitting: Family Medicine

## 2019-12-30 ENCOUNTER — Ambulatory Visit (INDEPENDENT_AMBULATORY_CARE_PROVIDER_SITE_OTHER): Payer: Medicare Other | Admitting: Family Medicine

## 2019-12-30 VITALS — BP 140/86 | HR 105 | Temp 95.8°F | Ht 68.0 in | Wt 240.5 lb

## 2019-12-30 DIAGNOSIS — M7918 Myalgia, other site: Secondary | ICD-10-CM

## 2019-12-30 DIAGNOSIS — M25561 Pain in right knee: Secondary | ICD-10-CM | POA: Diagnosis not present

## 2019-12-30 DIAGNOSIS — G8929 Other chronic pain: Secondary | ICD-10-CM

## 2019-12-30 NOTE — Patient Instructions (Signed)
If you do not hear anything about your referral in the next 1-2 weeks, call our office and ask for an update.  Ice/cold pack over area for 10-15 min twice daily.  Heat (pad or rice pillow in microwave) over affected area, 10-15 minutes twice daily.   OK to take Tylenol 1000 mg (2 extra strength tabs) or 975 mg (3 regular strength tabs) every 6 hours as needed.  Stretching and range of motion exercises These exercises warm up your muscles and joints and improve the movement and flexibility of your knee. These exercises also help to relieve pain and stiffness.  Exercise A: Knee flexion, active 1. Lie on your back with both knees straight. If this causes back discomfort, bend your uninjured knee so your foot is flat on the floor. 2. Slowly slide your left / right heel back toward your buttocks until you feel a gentle stretch in the front of your knee or thigh. Stop if you have pain. 3. Hold for3 seconds. 4. Slowly slide your left / right heel back to the starting position. 10 total repetitions. Repeat 2 times. Complete this exercise 3 times a week.  Exercise B: Knee extension, sitting 1. Sit with your left / right heel propped on a chair, a coffee table, or a footstool. Do not have anything under your knee to support it. 2. Allow your leg muscles to relax, letting gravity straighten out your knee. You should feel a stretch behind your left / right knee. 3. If told by your health care provide just above your kneecap. 4. Hold this position for 3 seconds. 5. Repeat for a total of 10 repetitions. Repeat 2 times. Complete this stretch 3 times a week.  Strengthening exercises These exercises build strength and endurance in your knee. Endurance is the ability to use your muscles for a long time, even after they get tired.  Exercise C: Quadriceps, isometric 1. Lie on your back with your left / right leg extended and your other knee bent. Put a rolled towel or small pillow under your right/left  knee if told by your health care provider. 2. Slowly tense the muscles in the front of your left / right thigh by pushing the back of your knee down. You should see your knee cap slide up toward your hip or see increased dimpling just above the knee. 3. For 3 seconds, keep the muscle as tight as you can without increasing your pain. 4. Relax the muscles slowly and completely. Repeat for 10 total repetitions. Repeat 2 times. Complete this exercise 3 times a week. Exercise D: Straight leg raises (quadriceps) 1. Lie on your back with your left / right leg extended and your other knee bent. 2. Tense the muscles in the front of your left / right thigh. You should see your kneecap slide up or see increased dimpling just above the knee. 3. Keep these muscles tight as you raise your leg 4-6 inches (10-15 cm) off the floor. 4. Hold this position for 3 seconds. 5. Keep these muscles tense as you lower your leg. 6. Relax the muscles slowly and completely. Repeat for a total of 10 repetitions. Repeat 2 times. Complete this exercise 3 times a week.  Exercise E: Hamstring curls 1. On the floor or a bed, lie on your abdomen with your legs straight. Put a folded towel or small pillow under your left / right thigh, just above your kneecap. 2. Slowly bend your left / right knee as far as you can  without pain. Keep your hips flat against the floor or bed. 3. Hold this position for 3 seconds. 4. Slowly lower your leg to the starting position. Repeat for a total of 10 repetitions. Repeat 2 times. Complete this exercise 3 times per week.  Stretching exercises These exercises warm up your muscles and joints and improve the movement and flexibility of your knee. These exercises also help to relieve pain and stiffness.  Exercise A: Quadriceps, prone 1. Lie on your abdomen on a firm surface, such as a bed or padded floor. 2. Bend your left / right knee and hold your ankle. If you cannot reach your ankle or pant leg,  loop a belt around your foot and grab the belt instead. 3. Gently pull your heel toward your buttocks. Your knee should not slide out to the side. You should feel a stretch in the front of your thigh and knee. 4. Hold this position for 30 seconds. Repeat 2 times. Complete this stretch 3 times a week.  Exercise B: Hamstring, doorway 1. Lie on your back in front of a doorway with your left / right leg resting against the wall and your other leg flat on the floor in the doorway. There should be a slight bend in your left / right knee. 2. Straighten your left / right knee. You should feel a stretch behind your knee or thigh. If you do not feel that stretch, scoot your buttocks closer to the door. 3. Hold this position for 30 seconds. Repeat 2 times. Complete this stretch 3 times a week.  Strengthening exercises These exercises build strength and endurance in your knee and leg muscles. Endurance is the ability to use your muscles for a long time, even after they get tired.   Exercise D: Wall slides (quadriceps) 1. Lean your back against a smooth wall or door, and walk your feet out 18-24 inches (45-61 cm) from it. 2. Place your feet hip-width apart. 3. Slowly slide down the wall or door until your knees bend 90 degrees. Keep your knees over your heels, not over your toes. Keep your knees in line with your hips. 4. Hold for 2 seconds. 5. Stand up to rest for 60 seconds. Repeat 2 times. Complete this exercise 3 times a week.  Exercise E: Bridge (hip extensors) 1. Lie on your back on a firm surface with your knees bent and your feet flat on the floor. 2. Tighten your buttocks muscles and lift your bottom off the floor until your trunk is level with your thighs. ? Do not arch your back. ? You should feel the muscles working in your buttocks and the back of your thighs. 3. Hold this position for 2 seconds. 4. Slowly lower your hips to the starting position. 5. Let your buttocks muscles relax  completely between repetitions. Repeat 2 times. Complete this exercise 3 times a week.  Gluteus Medius Syndrome Rehab It is normal to feel mild stretching, pulling, tightness, or discomfort as you do these exercises, but you should stop right away if you feel sudden pain or your pain gets worse.   Stretching and range of motion exercise This exercise warms up your muscles and joints and improves the movement and flexibility of your hip and pelvis. This exercise also helps to relieve pain and stiffness. Exercise A: Lunge (hip flexor stretch)     1. Kneel on the floor on your left / right knee. Bend your other knee so it is directly over your ankle. 2.  Keep good posture with your head over your shoulders. Tuck your tailbone underneath you. This will prevent your back from arching too much. 3. You should feel a gentle stretch in the front of your thigh or hip. If you do not feel a stretch, slowly lunge forward with your chest up. 4. Hold this position for 30 seconds. 5. Slowly return to the starting position. Repeat 2 times. Complete this exercise 3 times per week. Strengthening exercises These exercises build strength and endurance in your hip and pelvis. Endurance is the ability to use your muscles for a long time, even after they get tired. Exercise B: Bridge (hip extensors)    1. Lie on your back on a firm surface with your knees bent and your feet flat on the floor. 2. Tighten your buttocks muscles and lift your bottom off the floor until the trunk of your body is level with your thighs. ? You should feel the muscles working in your buttocks and the back of your thighs. If this exercise is too easy, cross your arms over your chest or lift one leg while your bottom is up off the floor. ? Do not arch your back. 3. Hold this position for 3 seconds. 4. Slowly lower your hips to the starting position. 5. Let your muscles relax completely between repetitions. Repeat 2 times. Complete this  exercise 3 times per week. Exercise C: Straight leg raises (hip abductors)    1. Lie on your side with your left / right leg in the top position. Lie so your head, shoulder, knee, and hip line up. Bend your bottom knee to help you balance. 2. Lift your top leg up 4-6 inches (10-15 cm), keeping your toes pointed straight ahead. 3. Hold this position for 2 seconds. 4. Slowly lower your leg to the starting position and let your muscles relax completely. Repeat for a total of 10 repetitions. Repeat 2 times. Complete this exercise 3 times per week. Exercise D: Hip abductors and external rotators, quadruped 1. Get on your hands and knees on a firm, lightly padded surface. Your hands should be directly below your shoulders, and your knees should be directly below your hips. 2. Lift your left / right knee out to the side. Keep your knee bent. Do not twist your body. 3. Hold this position for 3 seconds. 4. Slowly lower your leg. Repeat for a total of 10 repetitions.  Repeat 2 times. Complete this exercise 3 times per week. Exercise E: Single leg stand 1. Stand near a counter or door frame to hold onto as needed. It is helpful to look in a mirror for this exercise so you can watch your hip. 2. Squeeze your left / right buttock muscles then lift up your other foot. Do not let your left / righthip push out to the side. 3. Hold this position for 3 seconds. Repeat for a total of 10 repetitions. Repeat 2 times. Complete this exercise 3 times per week. Make sure you discuss any questions you have with your health care provider. Document Released: 07/07/2005 Document Revised: 03/13/2016 Document Reviewed: 06/19/2015 Elsevier Interactive Patient Education  [employer].

## 2019-12-30 NOTE — Progress Notes (Signed)
Musculoskeletal Exam  Patient: Joseph Mayer DOB: [birth year]  DOS: 12/30/2019  SUBJECTIVE:  Chief Complaint:   Chief Complaint  Patient presents with  . Hip Pain    right  . Knee Injury    right    [name] is a 69 y.o.  male for evaluation and treatment of R hip/knee pain.   Onset:  6 months ago. Went from a walk to brisk walk/light jog and started having outer hip pain.  Location: outer buttock Character:  aching and sharp  Progression of issue:  has worsened Associated symptoms: swelling in knee- felt like it was popping while in the car; no bruising, redness or swelling; R knee will sometimes feel unstable Treatment: to date has been rest.   Neurovascular symptoms: no  Past Medical History:  Diagnosis Date  . Diverticulitis   . Hypertension   . Migraine     Objective: VITAL SIGNS: BP 140/86 (BP Location: Left Arm, Patient Position: Sitting, Cuff Size: Large)   Pulse (!) 105   Temp (!) 95.8 F (35.4 C) (Temporal)   Ht 5\' 8"  (1.727 m)   Wt 240 lb 8 oz (109.1 kg)   SpO2 99%   BMI 36.57 kg/m  Constitutional: Well formed, well developed. No acute distress. Cardiovascular: Brisk cap refill Thorax & Lungs: No accessory muscle use Musculoskeletal: R knee.   Normal active range of motion: yes.   Normal passive range of motion: yes Tenderness to palpation: mild lateral jt line ttp Deformity: no Ecchymosis: no Tests positive: Pain w McMurray's but no click, Stine's Tests negative: varus/valgus, Lachman's, patellar app/grind R hip- mild ttp over lateral R glute medius, no ttp over greater troch, neg log roll/Stinchfield Neurologic: Normal sensory function. Psychiatric: Normal mood. Age appropriate judgment and insight. Alert & oriented x 3.    Assessment:  Chronic pain of right knee - Plan: Ambulatory referral to Sports Medicine  Right buttock pain  Plan: Stretches/exercises for both knee and buttock. Concern for lat meniscus. Refer to sports med  for possible [place]. Tylenol, ice, activity as tolerated. F/u as originally scheduled. The patient voiced understanding and agreement to the plan.   [place], DO 12/30/19  2:17 PM

## 2020-01-11 ENCOUNTER — Encounter: Payer: Self-pay | Admitting: Family Medicine

## 2020-01-11 ENCOUNTER — Other Ambulatory Visit: Payer: Self-pay

## 2020-01-11 ENCOUNTER — Ambulatory Visit (INDEPENDENT_AMBULATORY_CARE_PROVIDER_SITE_OTHER): Payer: Medicare Other | Admitting: Family Medicine

## 2020-01-11 ENCOUNTER — Ambulatory Visit: Payer: Self-pay

## 2020-01-11 VITALS — BP 163/106 | Ht 68.0 in | Wt 240.0 lb

## 2020-01-11 DIAGNOSIS — M1711 Unilateral primary osteoarthritis, right knee: Secondary | ICD-10-CM | POA: Insufficient documentation

## 2020-01-11 DIAGNOSIS — M25561 Pain in right knee: Secondary | ICD-10-CM

## 2020-01-11 NOTE — Progress Notes (Signed)
Medication Samples have been provided to the patient.  Drug name: Pennsaid       Strength: 2%        Qty: 2 boxes  LOT: [id number]  Exp.Date: 08/2020  Dosing instructions: use a pea size amount and rub gently.  The patient has been instructed regarding the correct time, dose, and frequency of taking this medication, including desired effects and most common side effects.   Sherrie George, [place] 11:07 AM 01/11/2020

## 2020-01-11 NOTE — Progress Notes (Signed)
Joseph Mayer - 69 y.o. male MRN [id number]  Date of birth: Jun 19, 1951  SUBJECTIVE:  Including CC & ROS.  Chief Complaint  Patient presents with  . Knee Pain    right    Joseph Mayer is a 69 y.o. male that is presenting with right knee pain.  The pain is lateral in nature.  He seems to notice it more with getting up from a seated position.  He has pain with lifting and cannot take his knee into full flexion.  No history of similar pain.  No trauma or inciting event.  No history of surgery.   Review of Systems See HPI   HISTORY: Past Medical, Surgical, Social, and Family History Reviewed & Updated per EMR.   Pertinent Historical Findings include:  Past Medical History:  Diagnosis Date  . Diverticulitis   . Hypertension   . Migraine     Past Surgical History:  Procedure Laterality Date  . APPENDECTOMY  1994  . BACK SURGERY  2005    Family History  Problem Relation Age of Onset  . Diabetes Mother   . Early death Mother   . Heart disease Mother   . Stroke Mother   . Early death Maternal Grandmother   . Arthritis Maternal Grandfather        RA  . Early death Paternal Grandfather     Social History   Socioeconomic History  . Marital status: Unknown    Spouse name: Not on file  . Number of children: Not on file  . Years of education: Not on file  . Highest education level: Not on file  Occupational History  . Not on file  Tobacco Use  . Smoking status: Never Smoker  . Smokeless tobacco: Never Used  Substance and Sexual Activity  . Alcohol use: Yes    Comment: rarely  . Drug use: Never  . Sexual activity: Not on file  Other Topics Concern  . Not on file  Social History Narrative  . Not on file   Social Determinants of Health   Financial Resource Strain:   . Difficulty of Paying Living Expenses:   Food Insecurity:   . Worried About Charity fundraiser in the Last Year:   . Arboriculturist in the Last Year:   Transportation Needs:   . Lexicographer (Medical):   Marland Kitchen Lack of Transportation (Non-Medical):   Physical Activity:   . Days of Exercise per Week:   . Minutes of Exercise per Session:   Stress:   . Feeling of Stress :   Social Connections:   . Frequency of Communication with Friends and Family:   . Frequency of Social Gatherings with Friends and Family:   . Attends Religious Services:   . Active Member of Clubs or Organizations:   . Attends Archivist Meetings:   Marland Kitchen Marital Status:   Intimate Partner Violence:   . Fear of Current or Ex-Partner:   . Emotionally Abused:   Marland Kitchen Physically Abused:   . Sexually Abused:      PHYSICAL EXAM:  VS: BP (!) 163/106   Ht 5\' 8"  (1.727 m)   Wt 240 lb (108.9 kg)   BMI 36.49 kg/m  Physical Exam Gen: NAD, alert, cooperative with exam, well-appearing MSK:  Right knee: No obvious effusion. No tenderness to palpation over the medial lateral joint space.   Pain with full flexion. Pain getting into full extension. No instability valgus or varus stress testing. Negative  McMurray's test. No pain with patellar grind. Neurovascularly intact  Limited ultrasound: Right knee:  Mild effusion of the suprapatellar pouch. Normal-appearing quadricep and patellar tendon. Normal joint space in the medial compartment.  Mild degenerative changes of the meniscus. Severe degenerative changes of the joint space with significant outpouching of the lateral meniscus.  Summary: Degenerative changes in the lateral compartment are severe in nature.  Ultrasound and interpretation by Clearance Coots, MD   ASSESSMENT & PLAN:   Primary osteoarthritis of right knee Lateral compartment has got degenerative changes of the joint space and meniscus.  Like to the reason for his pain. -Counseled on home exercise therapy and supportive care. -Provided Pennsaid samples. -Could consider physical therapy, imaging or injection.

## 2020-01-11 NOTE — Assessment & Plan Note (Signed)
Lateral compartment has got degenerative changes of the joint space and meniscus.  Like to the reason for his pain. -Counseled on home exercise therapy and supportive care. -Provided Pennsaid samples. -Could consider physical therapy, imaging or injection.

## 2020-01-11 NOTE — Patient Instructions (Signed)
Nice to meet you Please try ice  Please try to continue the strength training  Please try the rub on medicine   Please send me a message in [place] with any questions or updates.  Please see me back in 4 weeks.   --Dr. Raeford Razor

## 2020-01-31 ENCOUNTER — Encounter (INDEPENDENT_AMBULATORY_CARE_PROVIDER_SITE_OTHER): Payer: Medicare Other | Admitting: Ophthalmology

## 2020-01-31 ENCOUNTER — Other Ambulatory Visit: Payer: Self-pay

## 2020-01-31 DIAGNOSIS — I10 Essential (primary) hypertension: Secondary | ICD-10-CM | POA: Diagnosis not present

## 2020-01-31 DIAGNOSIS — H35033 Hypertensive retinopathy, bilateral: Secondary | ICD-10-CM

## 2020-01-31 DIAGNOSIS — H353121 Nonexudative age-related macular degeneration, left eye, early dry stage: Secondary | ICD-10-CM

## 2020-01-31 DIAGNOSIS — H33301 Unspecified retinal break, right eye: Secondary | ICD-10-CM

## 2020-01-31 DIAGNOSIS — H353211 Exudative age-related macular degeneration, right eye, with active choroidal neovascularization: Secondary | ICD-10-CM

## 2020-01-31 DIAGNOSIS — H43813 Vitreous degeneration, bilateral: Secondary | ICD-10-CM

## 2020-02-13 ENCOUNTER — Ambulatory Visit (INDEPENDENT_AMBULATORY_CARE_PROVIDER_SITE_OTHER): Payer: Medicare Other | Admitting: Family Medicine

## 2020-02-13 ENCOUNTER — Encounter: Payer: Self-pay | Admitting: Family Medicine

## 2020-02-13 ENCOUNTER — Ambulatory Visit (HOSPITAL_BASED_OUTPATIENT_CLINIC_OR_DEPARTMENT_OTHER)
Admission: RE | Admit: 2020-02-13 | Discharge: 2020-02-13 | Disposition: A | Discharge status: Home / Self Care | Payer: Medicare Other | Source: Ambulatory Visit | Attending: Family Medicine | Admitting: Family Medicine

## 2020-02-13 ENCOUNTER — Other Ambulatory Visit: Payer: Self-pay

## 2020-02-13 VITALS — Ht 68.0 in | Wt 230.0 lb

## 2020-02-13 DIAGNOSIS — M1711 Unilateral primary osteoarthritis, right knee: Secondary | ICD-10-CM

## 2020-02-13 DIAGNOSIS — M25551 Pain in right hip: Secondary | ICD-10-CM | POA: Insufficient documentation

## 2020-02-13 DIAGNOSIS — M47816 Spondylosis without myelopathy or radiculopathy, lumbar region: Secondary | ICD-10-CM | POA: Diagnosis not present

## 2020-02-13 DIAGNOSIS — M1611 Unilateral primary osteoarthritis, right hip: Secondary | ICD-10-CM | POA: Diagnosis not present

## 2020-02-13 MED ORDER — PREDNISONE 5 MG PO TABS: ORAL_TABLET | ORAL | 0 refills | Status: DC

## 2020-02-13 NOTE — Patient Instructions (Signed)
Good to see you Please continue ice  Please try the exercises  I will call with the results.   Please send me a message in MyChart with any questions or updates.  Please see me back in 4 weeks.   --Dr. Raeford Razor

## 2020-02-13 NOTE — Progress Notes (Signed)
Joseph Mayer - 69 y.o. male MRN [id number]  Date of birth: 1950-12-08  SUBJECTIVE:  Including CC & ROS.  Chief Complaint  Patient presents with  . Follow-up    right knee    Joseph Mayer is a 69 y.o. male that is presenting with right hip pain and acute worsening of the right knee pain.  It is worse with going up and down stairs.  He has pain over the lateral aspect of the gluteus..   Review of Systems See HPI   HISTORY: Past Medical, Surgical, Social, and Family History Reviewed & Updated per EMR.   Pertinent Historical Findings include:  Past Medical History:  Diagnosis Date  . Diverticulitis   . Hypertension   . Migraine     Past Surgical History:  Procedure Laterality Date  . APPENDECTOMY  1994  . BACK SURGERY  2005    Family History  Problem Relation Age of Onset  . Diabetes Mother   . Early death Mother   . Heart disease Mother   . Stroke Mother   . Early death Maternal Grandmother   . Arthritis Maternal Grandfather        RA  . Early death Paternal Grandfather     Social History   Socioeconomic History  . Marital status: Unknown    Spouse name: Not on file  . Number of children: Not on file  . Years of education: Not on file  . Highest education level: Not on file  Occupational History  . Not on file  Tobacco Use  . Smoking status: Never Smoker  . Smokeless tobacco: Never Used  Substance and Sexual Activity  . Alcohol use: Yes    Comment: rarely  . Drug use: Never  . Sexual activity: Not on file  Other Topics Concern  . Not on file  Social History Narrative  . Not on file   Social Determinants of Health   Financial Resource Strain:   . Difficulty of Paying Living Expenses:   Food Insecurity:   . Worried About Charity fundraiser in the Last Year:   . Arboriculturist in the Last Year:   Transportation Needs:   . Film/video editor (Medical):   Marland Kitchen Lack of Transportation (Non-Medical):   Physical Activity:   . Days of  Exercise per Week:   . Minutes of Exercise per Session:   Stress:   . Feeling of Stress :   Social Connections:   . Frequency of Communication with Friends and Family:   . Frequency of Social Gatherings with Friends and Family:   . Attends Religious Services:   . Active Member of Clubs or Organizations:   . Attends Archivist Meetings:   Marland Kitchen Marital Status:   Intimate Partner Violence:   . Fear of Current or Ex-Partner:   . Emotionally Abused:   Marland Kitchen Physically Abused:   . Sexually Abused:      PHYSICAL EXAM:  VS: Ht 5\' 8"  (1.727 m)   Wt (!) 230 lb (104.3 kg)   BMI 34.97 kg/m  Physical Exam Gen: NAD, alert, cooperative with exam, well-appearing MSK:  Right hip: No tenderness palpation of the greater trochanter. Tenderness in the mid gluteus. Normal strength with hip flexion. Neurovascularly intact     ASSESSMENT & PLAN:   Primary osteoarthritis of right knee Pain occurring laterally as well as medially.  Previous imaging demonstrated lateral changes.  May have worsening of pain with weakness in hip abduction. -Counseled on  home exercise therapy and supportive care. -Prednisone. -Could consider physical therapy, imaging or injection.  Right hip pain Pain seems less likely associated with joint.  Seems more likely hip abduction in nature.  Has a history of radicular pain on the left side and it feels similar to that somewhat. -Counseled on home exercise therapy and supportive care. -X-ray. -Prednisone. -Could consider physical therapy, injection or looking into the back.

## 2020-02-13 NOTE — Assessment & Plan Note (Addendum)
Pain occurring laterally as well as medially.  Previous imaging demonstrated lateral changes.  May have worsening of pain with weakness in hip abduction. -Counseled on home exercise therapy and supportive care. -Prednisone. -Could consider physical therapy, imaging or injection.

## 2020-02-13 NOTE — Assessment & Plan Note (Signed)
Pain seems less likely associated with joint.  Seems more likely hip abduction in nature.  Has a history of radicular pain on the left side and it feels similar to that somewhat. -Counseled on home exercise therapy and supportive care. -X-ray. -Prednisone. -Could consider physical therapy, injection or looking into the back.

## 2020-02-15 ENCOUNTER — Telehealth: Payer: Self-pay | Admitting: Family Medicine

## 2020-02-15 NOTE — Telephone Encounter (Signed)
Patient returned provider's call --[phone].  --glh

## 2020-02-15 NOTE — Telephone Encounter (Signed)
Left VM for patient. If he calls back please have him speak with a nurse/CMA and inform that his xrays show mild arthritis of the joint. This is unlikely to be the source of his pain that he was experiencing. More likely his pain is related to either his back or hip abductors being weak.   If any questions then please take the best time and phone number to call and I will try to call him back.   Rosemarie Ax, MD [facility] 02/15/2020, 8:35 AM

## 2020-02-15 NOTE — Telephone Encounter (Signed)
Spoke with patient about results.   Rosemarie Ax, MD [facility] 02/15/2020, 3:37 PM

## 2020-03-30 ENCOUNTER — Other Ambulatory Visit: Payer: Self-pay

## 2020-03-30 ENCOUNTER — Encounter: Payer: Self-pay | Admitting: Family Medicine

## 2020-03-30 ENCOUNTER — Ambulatory Visit: Payer: Self-pay

## 2020-03-30 ENCOUNTER — Ambulatory Visit (INDEPENDENT_AMBULATORY_CARE_PROVIDER_SITE_OTHER): Payer: Medicare Other | Admitting: Family Medicine

## 2020-03-30 VITALS — BP 146/93 | HR 75 | Ht 68.0 in | Wt 230.0 lb

## 2020-03-30 DIAGNOSIS — M1711 Unilateral primary osteoarthritis, right knee: Secondary | ICD-10-CM

## 2020-03-30 DIAGNOSIS — M25551 Pain in right hip: Secondary | ICD-10-CM | POA: Diagnosis not present

## 2020-03-30 MED ORDER — METHYLPREDNISOLONE ACETATE 40 MG/ML IJ SUSP
40.0000 mg | Freq: Once | INTRAMUSCULAR | Status: AC
  Administered 2020-03-30: 40 mg via INTRA_ARTICULAR

## 2020-03-30 NOTE — Assessment & Plan Note (Signed)
Pain is occurring laterally which was just more of a trochanteric bursitis.  Imaging was unrevealing for any significant degenerative changes of the hip joint. -Counseled on home exercise therapy and supportive care. -Injection. -Could consider physical therapy.

## 2020-03-30 NOTE — Addendum Note (Signed)
Addended by: Sherrie George F on: 03/30/2020 10:00 AM   Modules accepted: Orders

## 2020-03-30 NOTE — Assessment & Plan Note (Signed)
Doing well with home exercises and therapies. -Continue home exercise therapy and supportive care. -Could consider injection or physical therapy.

## 2020-03-30 NOTE — Progress Notes (Signed)
Joseph Mayer - 69 y.o. male MRN [id number]  Date of birth: Jul 17, 1951  SUBJECTIVE:  Including CC & ROS.  Chief Complaint  Patient presents with  . Follow-up    right hip / right knee    Joseph Mayer is a 69 y.o. male that is presenting with worsening of his right hip pain and follow-up for his right knee.  He feels like his knee has been doing well.  He is able to strength train and has little to no pain.  He does experience some pain with going up and down stairs.  The right hip seems to be more of an issue.  Can stop him in his tracks..  Independent review of the right hip and pelvis x-ray from 7/26 shows mild degenerative changes in the joint space.   Review of Systems See HPI   HISTORY: Past Medical, Surgical, Social, and Family History Reviewed & Updated per EMR.   Pertinent Historical Findings include:  Past Medical History:  Diagnosis Date  . Diverticulitis   . Hypertension   . Migraine     Past Surgical History:  Procedure Laterality Date  . APPENDECTOMY  1994  . BACK SURGERY  2005    Family History  Problem Relation Age of Onset  . Diabetes Mother   . Early death Mother   . Heart disease Mother   . Stroke Mother   . Early death Maternal Grandmother   . Arthritis Maternal Grandfather        RA  . Early death Paternal Grandfather     Social History   Socioeconomic History  . Marital status: Married    Spouse name: Not on file  . Number of children: Not on file  . Years of education: Not on file  . Highest education level: Not on file  Occupational History  . Not on file  Tobacco Use  . Smoking status: Never Smoker  . Smokeless tobacco: Never Used  Substance and Sexual Activity  . Alcohol use: Yes    Comment: rarely  . Drug use: Never  . Sexual activity: Not on file  Other Topics Concern  . Not on file  Social History Narrative  . Not on file   Social Determinants of Health   Financial Resource Strain:   . Difficulty of Paying  Living Expenses: Not on file  Food Insecurity:   . Worried About Charity fundraiser in the Last Year: Not on file  . Ran Out of Food in the Last Year: Not on file  Transportation Needs:   . Lack of Transportation (Medical): Not on file  . Lack of Transportation (Non-Medical): Not on file  Physical Activity:   . Days of Exercise per Week: Not on file  . Minutes of Exercise per Session: Not on file  Stress:   . Feeling of Stress : Not on file  Social Connections:   . Frequency of Communication with Friends and Family: Not on file  . Frequency of Social Gatherings with Friends and Family: Not on file  . Attends Religious Services: Not on file  . Active Member of Clubs or Organizations: Not on file  . Attends Archivist Meetings: Not on file  . Marital Status: Not on file  Intimate Partner Violence:   . Fear of Current or Ex-Partner: Not on file  . Emotionally Abused: Not on file  . Physically Abused: Not on file  . Sexually Abused: Not on file     PHYSICAL EXAM:  VS: BP (!) 146/93   Pulse 75   Ht 5\' 8"  (1.727 m)   Wt 230 lb (104.3 kg)   BMI 34.97 kg/m  Physical Exam Gen: NAD, alert, cooperative with exam, well-appearing MSK:  Right hip: Tenderness to palpation of the greater trochanter. No swelling or ecchymosis. Normal strength with hip flexion. Neurovascularly intact   Aspiration/Injection Procedure Note Joseph Mayer 11/04/1950  Procedure: Injection Indications: Right hip pain  Procedure Details Consent: Risks of procedure as well as the alternatives and risks of each were explained to the (patient/caregiver).  Consent for procedure obtained. Time Out: Verified patient identification, verified procedure, site/side was marked, verified correct patient position, special equipment/implants available, medications/allergies/relevent history reviewed, required imaging and test results available.  Performed.  The area was cleaned with iodine and alcohol  swabs.    The  right greater trochanteric bursa was injected using 1 cc's of 40 mg Depo-Medrol and 4 cc's of 0.25% bupivacaine with a 22 3 1/2" needle.  Ultrasound was used. Images were obtained in short views showing the injection.     A sterile dressing was applied.  Patient did tolerate procedure well.     ASSESSMENT & PLAN:   Greater trochanteric pain syndrome of right lower extremity Pain is occurring laterally which was just more of a trochanteric bursitis.  Imaging was unrevealing for any significant degenerative changes of the hip joint. -Counseled on home exercise therapy and supportive care. -Injection. -Could consider physical therapy.  Primary osteoarthritis of right knee Doing well with home exercises and therapies. -Continue home exercise therapy and supportive care. -Could consider injection or physical therapy.

## 2020-03-30 NOTE — Patient Instructions (Signed)
Good to see you Happy Birthday!  Please try ice  Please avoid hip abduction for a few days.   Please send me a message in MyChart with any questions or updates.  Please see me back in 6-8 weeks.   --Dr. Raeford Razor

## 2020-04-09 DIAGNOSIS — H353211 Exudative age-related macular degeneration, right eye, with active choroidal neovascularization: Secondary | ICD-10-CM | POA: Diagnosis not present

## 2020-04-09 DIAGNOSIS — H2513 Age-related nuclear cataract, bilateral: Secondary | ICD-10-CM | POA: Diagnosis not present

## 2020-04-09 DIAGNOSIS — H353121 Nonexudative age-related macular degeneration, left eye, early dry stage: Secondary | ICD-10-CM | POA: Diagnosis not present

## 2020-04-09 DIAGNOSIS — H40051 Ocular hypertension, right eye: Secondary | ICD-10-CM | POA: Diagnosis not present

## 2020-04-09 DIAGNOSIS — H43813 Vitreous degeneration, bilateral: Secondary | ICD-10-CM | POA: Diagnosis not present

## 2020-04-16 DIAGNOSIS — Z23 Encounter for immunization: Secondary | ICD-10-CM | POA: Diagnosis not present

## 2020-05-14 DIAGNOSIS — Z23 Encounter for immunization: Secondary | ICD-10-CM | POA: Diagnosis not present

## 2020-05-14 DIAGNOSIS — H40051 Ocular hypertension, right eye: Secondary | ICD-10-CM | POA: Diagnosis not present

## 2020-05-29 ENCOUNTER — Encounter (INDEPENDENT_AMBULATORY_CARE_PROVIDER_SITE_OTHER): Payer: Medicare Other | Admitting: Ophthalmology

## 2020-05-29 ENCOUNTER — Other Ambulatory Visit: Payer: Self-pay

## 2020-05-29 DIAGNOSIS — H43813 Vitreous degeneration, bilateral: Secondary | ICD-10-CM

## 2020-05-29 DIAGNOSIS — H35033 Hypertensive retinopathy, bilateral: Secondary | ICD-10-CM

## 2020-05-29 DIAGNOSIS — I10 Essential (primary) hypertension: Secondary | ICD-10-CM | POA: Diagnosis not present

## 2020-05-29 DIAGNOSIS — H353211 Exudative age-related macular degeneration, right eye, with active choroidal neovascularization: Secondary | ICD-10-CM | POA: Diagnosis not present

## 2020-05-29 DIAGNOSIS — H353121 Nonexudative age-related macular degeneration, left eye, early dry stage: Secondary | ICD-10-CM

## 2020-05-29 DIAGNOSIS — H33301 Unspecified retinal break, right eye: Secondary | ICD-10-CM | POA: Diagnosis not present

## 2020-05-30 ENCOUNTER — Ambulatory Visit: Payer: Medicare Other | Admitting: Family Medicine

## 2020-08-08 ENCOUNTER — Other Ambulatory Visit: Payer: Self-pay | Admitting: Family Medicine

## 2020-08-08 DIAGNOSIS — I10 Essential (primary) hypertension: Secondary | ICD-10-CM

## 2020-08-08 MED ORDER — LOSARTAN POTASSIUM 100 MG PO TABS: 100.0000 mg | ORAL_TABLET | Freq: Every day | ORAL | 0 refills | Status: DC

## 2020-08-08 MED ORDER — HYDROCHLOROTHIAZIDE 25 MG PO TABS: 25.0000 mg | ORAL_TABLET | Freq: Every day | ORAL | 0 refills | Status: DC

## 2020-08-10 ENCOUNTER — Other Ambulatory Visit: Payer: Self-pay | Admitting: Family Medicine

## 2020-08-10 DIAGNOSIS — I10 Essential (primary) hypertension: Secondary | ICD-10-CM

## 2020-09-01 ENCOUNTER — Other Ambulatory Visit: Payer: Self-pay | Admitting: Family Medicine

## 2020-09-02 ENCOUNTER — Other Ambulatory Visit: Payer: Self-pay | Admitting: Family Medicine

## 2020-09-25 ENCOUNTER — Encounter (INDEPENDENT_AMBULATORY_CARE_PROVIDER_SITE_OTHER): Payer: Medicare Other | Admitting: Ophthalmology

## 2020-09-25 ENCOUNTER — Other Ambulatory Visit: Payer: Self-pay | Admitting: Family Medicine

## 2020-09-25 ENCOUNTER — Other Ambulatory Visit: Payer: Self-pay

## 2020-09-25 DIAGNOSIS — H35033 Hypertensive retinopathy, bilateral: Secondary | ICD-10-CM | POA: Diagnosis not present

## 2020-09-25 DIAGNOSIS — I10 Essential (primary) hypertension: Secondary | ICD-10-CM

## 2020-09-25 DIAGNOSIS — H43813 Vitreous degeneration, bilateral: Secondary | ICD-10-CM | POA: Diagnosis not present

## 2020-09-25 DIAGNOSIS — H33301 Unspecified retinal break, right eye: Secondary | ICD-10-CM

## 2020-09-25 DIAGNOSIS — H353121 Nonexudative age-related macular degeneration, left eye, early dry stage: Secondary | ICD-10-CM | POA: Diagnosis not present

## 2020-09-25 DIAGNOSIS — H353211 Exudative age-related macular degeneration, right eye, with active choroidal neovascularization: Secondary | ICD-10-CM | POA: Diagnosis not present

## 2020-10-19 ENCOUNTER — Other Ambulatory Visit: Payer: Self-pay | Admitting: Family Medicine

## 2020-11-11 ENCOUNTER — Other Ambulatory Visit: Payer: Self-pay | Admitting: Family Medicine

## 2020-11-14 ENCOUNTER — Other Ambulatory Visit: Payer: Self-pay | Admitting: Family Medicine

## 2020-12-09 ENCOUNTER — Other Ambulatory Visit: Payer: Self-pay | Admitting: Family Medicine

## 2021-01-02 ENCOUNTER — Other Ambulatory Visit: Payer: Self-pay | Admitting: Family Medicine

## 2021-01-15 ENCOUNTER — Encounter (INDEPENDENT_AMBULATORY_CARE_PROVIDER_SITE_OTHER): Payer: Medicare Other | Admitting: Ophthalmology

## 2021-01-15 ENCOUNTER — Other Ambulatory Visit: Payer: Self-pay

## 2021-01-15 DIAGNOSIS — I10 Essential (primary) hypertension: Secondary | ICD-10-CM

## 2021-01-15 DIAGNOSIS — H35033 Hypertensive retinopathy, bilateral: Secondary | ICD-10-CM

## 2021-01-15 DIAGNOSIS — H33301 Unspecified retinal break, right eye: Secondary | ICD-10-CM | POA: Diagnosis not present

## 2021-01-15 DIAGNOSIS — H43813 Vitreous degeneration, bilateral: Secondary | ICD-10-CM

## 2021-01-15 DIAGNOSIS — H353121 Nonexudative age-related macular degeneration, left eye, early dry stage: Secondary | ICD-10-CM | POA: Diagnosis not present

## 2021-01-15 DIAGNOSIS — H353211 Exudative age-related macular degeneration, right eye, with active choroidal neovascularization: Secondary | ICD-10-CM | POA: Diagnosis not present

## 2021-01-28 ENCOUNTER — Other Ambulatory Visit: Payer: Self-pay | Admitting: Family Medicine

## 2021-02-11 ENCOUNTER — Other Ambulatory Visit: Payer: Self-pay

## 2021-02-11 ENCOUNTER — Ambulatory Visit (INDEPENDENT_AMBULATORY_CARE_PROVIDER_SITE_OTHER): Payer: Medicare Other | Admitting: Family Medicine

## 2021-02-11 ENCOUNTER — Encounter: Payer: Self-pay | Admitting: Family Medicine

## 2021-02-11 ENCOUNTER — Ambulatory Visit: Payer: Medicare Other | Admitting: Family Medicine

## 2021-02-11 VITALS — BP 132/80 | HR 70 | Temp 98.0°F | Ht 68.0 in | Wt 229.4 lb

## 2021-02-11 DIAGNOSIS — Z7184 Encounter for health counseling related to travel: Secondary | ICD-10-CM | POA: Diagnosis not present

## 2021-02-11 MED ORDER — LOSARTAN POTASSIUM-HCTZ 100-25 MG PO TABS: 1.0000 | ORAL_TABLET | Freq: Every day | ORAL | 2 refills | Status: DC

## 2021-02-11 MED ORDER — SCOPOLAMINE 1 MG/3DAYS TD PT72: 1.0000 | MEDICATED_PATCH | TRANSDERMAL | 0 refills | Status: DC

## 2021-02-11 MED ORDER — ONDANSETRON 4 MG PO TBDP: 4.0000 mg | ORAL_TABLET | Freq: Three times a day (TID) | ORAL | 0 refills | Status: DC | PRN

## 2021-02-11 MED ORDER — AZITHROMYCIN 500 MG PO TABS: ORAL_TABLET | ORAL | 0 refills | Status: DC

## 2021-02-11 NOTE — Progress Notes (Signed)
Chief Complaint  Patient presents with   need something for sea sickness    Subjective: Patient is a 70 y.o. male here for travel advice.  Patient is going on an [place] cruise in around 1 month.  Every time he has gone on a cruise in the past, he has gotten motion sickness along with his wife.  He has no specific medicine of choice.  Past Medical History:  Diagnosis Date   Diverticulitis    Hypertension    Migraine     Objective: BP 132/80   Pulse 70   Temp 98 F (36.7 C) (Oral)   Ht '5\' 8"'$  (1.727 m)   Wt 229 lb 6 oz (104 kg)   SpO2 97%   BMI 34.88 kg/m  General: Awake, appears stated age Lungs: No accessory muscle use Psych: Age appropriate judgment and insight, normal affect and mood  Assessment and Plan: Travel advice encounter - Plan: azithromycin (ZITHROMAX) 500 MG tablet, scopolamine (TRANSDERM-SCOP) 1 MG/3DAYS, ondansetron (ZOFRAN-ODT) 4 MG disintegrating tablet  Zithromax 500 mg daily for 3 days should he encounter traveler's diarrhea or pneumonia.  Scopolamine patch sent in for motion sickness.  Zofran sent in for as needed nausea use.  Follow-up as originally scheduled. The patient voiced understanding and agreement to the plan.  [place], DO 02/11/21  9:03 AM

## 2021-02-11 NOTE — Patient Instructions (Signed)
Have a great trip.  You could try Dramamine also as there would not be any drug interactions.  Let us know if you need anything.

## 2021-02-13 ENCOUNTER — Other Ambulatory Visit: Payer: Self-pay | Admitting: Family Medicine

## 2021-02-13 MED ORDER — PREDNISONE 20 MG PO TABS: 40.0000 mg | ORAL_TABLET | Freq: Every day | ORAL | 0 refills | Status: AC

## 2021-02-15 ENCOUNTER — Other Ambulatory Visit: Payer: Self-pay | Admitting: Family Medicine

## 2021-02-15 MED ORDER — MOLNUPIRAVIR EUA 200MG CAPSULE: 4.0000 | ORAL_CAPSULE | Freq: Two times a day (BID) | ORAL | 0 refills | Status: AC

## 2021-02-20 ENCOUNTER — Other Ambulatory Visit: Payer: Self-pay | Admitting: Family Medicine

## 2021-04-22 ENCOUNTER — Telehealth: Payer: Self-pay | Admitting: Family Medicine

## 2021-04-22 NOTE — Telephone Encounter (Signed)
Left message for patient to call back and schedule Medicare Annual Wellness Visit (AWV) in office.   If not able to come in office, please offer to do virtually or by telephone.  Left office number and my jabber [phone].  Last AWV:06/01/2019  Please schedule at anytime with Nurse Health Advisor.

## 2021-05-14 DIAGNOSIS — H353121 Nonexudative age-related macular degeneration, left eye, early dry stage: Secondary | ICD-10-CM | POA: Diagnosis not present

## 2021-05-14 DIAGNOSIS — H353211 Exudative age-related macular degeneration, right eye, with active choroidal neovascularization: Secondary | ICD-10-CM | POA: Diagnosis not present

## 2021-05-14 DIAGNOSIS — H40051 Ocular hypertension, right eye: Secondary | ICD-10-CM | POA: Diagnosis not present

## 2021-05-21 ENCOUNTER — Other Ambulatory Visit: Payer: Self-pay

## 2021-05-21 ENCOUNTER — Encounter (INDEPENDENT_AMBULATORY_CARE_PROVIDER_SITE_OTHER): Payer: Medicare Other | Admitting: Ophthalmology

## 2021-05-21 DIAGNOSIS — H35033 Hypertensive retinopathy, bilateral: Secondary | ICD-10-CM

## 2021-05-21 DIAGNOSIS — H353121 Nonexudative age-related macular degeneration, left eye, early dry stage: Secondary | ICD-10-CM

## 2021-05-21 DIAGNOSIS — H2513 Age-related nuclear cataract, bilateral: Secondary | ICD-10-CM

## 2021-05-21 DIAGNOSIS — H43813 Vitreous degeneration, bilateral: Secondary | ICD-10-CM | POA: Diagnosis not present

## 2021-05-21 DIAGNOSIS — H353211 Exudative age-related macular degeneration, right eye, with active choroidal neovascularization: Secondary | ICD-10-CM | POA: Diagnosis not present

## 2021-05-21 DIAGNOSIS — H33301 Unspecified retinal break, right eye: Secondary | ICD-10-CM | POA: Diagnosis not present

## 2021-05-21 DIAGNOSIS — I10 Essential (primary) hypertension: Secondary | ICD-10-CM

## 2021-06-20 DIAGNOSIS — Z23 Encounter for immunization: Secondary | ICD-10-CM | POA: Diagnosis not present

## 2021-07-05 ENCOUNTER — Telehealth: Payer: Self-pay | Admitting: Family Medicine

## 2021-07-05 NOTE — Telephone Encounter (Signed)
I attempted to leave message for patient to call back and schedule Medicare Annual Wellness Visit (AWV) in office.  No voice mail.  If not able to come in office, please offer to do virtually or by telephone.  Left office number and my jabber [phone].  Last AWV:06/01/2019  Please schedule at anytime with Nurse Health Advisor.

## 2021-09-17 ENCOUNTER — Encounter (INDEPENDENT_AMBULATORY_CARE_PROVIDER_SITE_OTHER): Payer: Medicare Other | Admitting: Ophthalmology

## 2021-09-17 ENCOUNTER — Other Ambulatory Visit: Payer: Self-pay

## 2021-09-17 DIAGNOSIS — H43813 Vitreous degeneration, bilateral: Secondary | ICD-10-CM | POA: Diagnosis not present

## 2021-09-17 DIAGNOSIS — H33301 Unspecified retinal break, right eye: Secondary | ICD-10-CM

## 2021-09-17 DIAGNOSIS — H2513 Age-related nuclear cataract, bilateral: Secondary | ICD-10-CM

## 2021-09-17 DIAGNOSIS — I10 Essential (primary) hypertension: Secondary | ICD-10-CM

## 2021-09-17 DIAGNOSIS — H353121 Nonexudative age-related macular degeneration, left eye, early dry stage: Secondary | ICD-10-CM

## 2021-09-17 DIAGNOSIS — H353211 Exudative age-related macular degeneration, right eye, with active choroidal neovascularization: Secondary | ICD-10-CM | POA: Diagnosis not present

## 2021-09-17 DIAGNOSIS — H35033 Hypertensive retinopathy, bilateral: Secondary | ICD-10-CM

## 2021-09-19 ENCOUNTER — Telehealth: Payer: Self-pay | Admitting: Family Medicine

## 2021-09-19 NOTE — Telephone Encounter (Signed)
Left message for patient to call back and schedule Medicare Annual Wellness Visit (AWV) in office.  ? ?If not able to come in office, please offer to do virtually or by telephone.  Left office number and my jabber [phone]. ? ?Last AWV:06/01/2019 ? ?Please schedule at anytime with Nurse Health Advisor. ?  ?

## 2021-10-01 IMAGING — DX DG HIP (WITH OR WITHOUT PELVIS) 2-3V*R*
3 series · 3 of 3 positions shown · non-contrast
Comparison: No prior.

CLINICAL DATA: Right hip pain.  No known injury.

EXAM:
DG HIP (WITH OR WITHOUT PELVIS) 2-3V RIGHT

[hip ap]
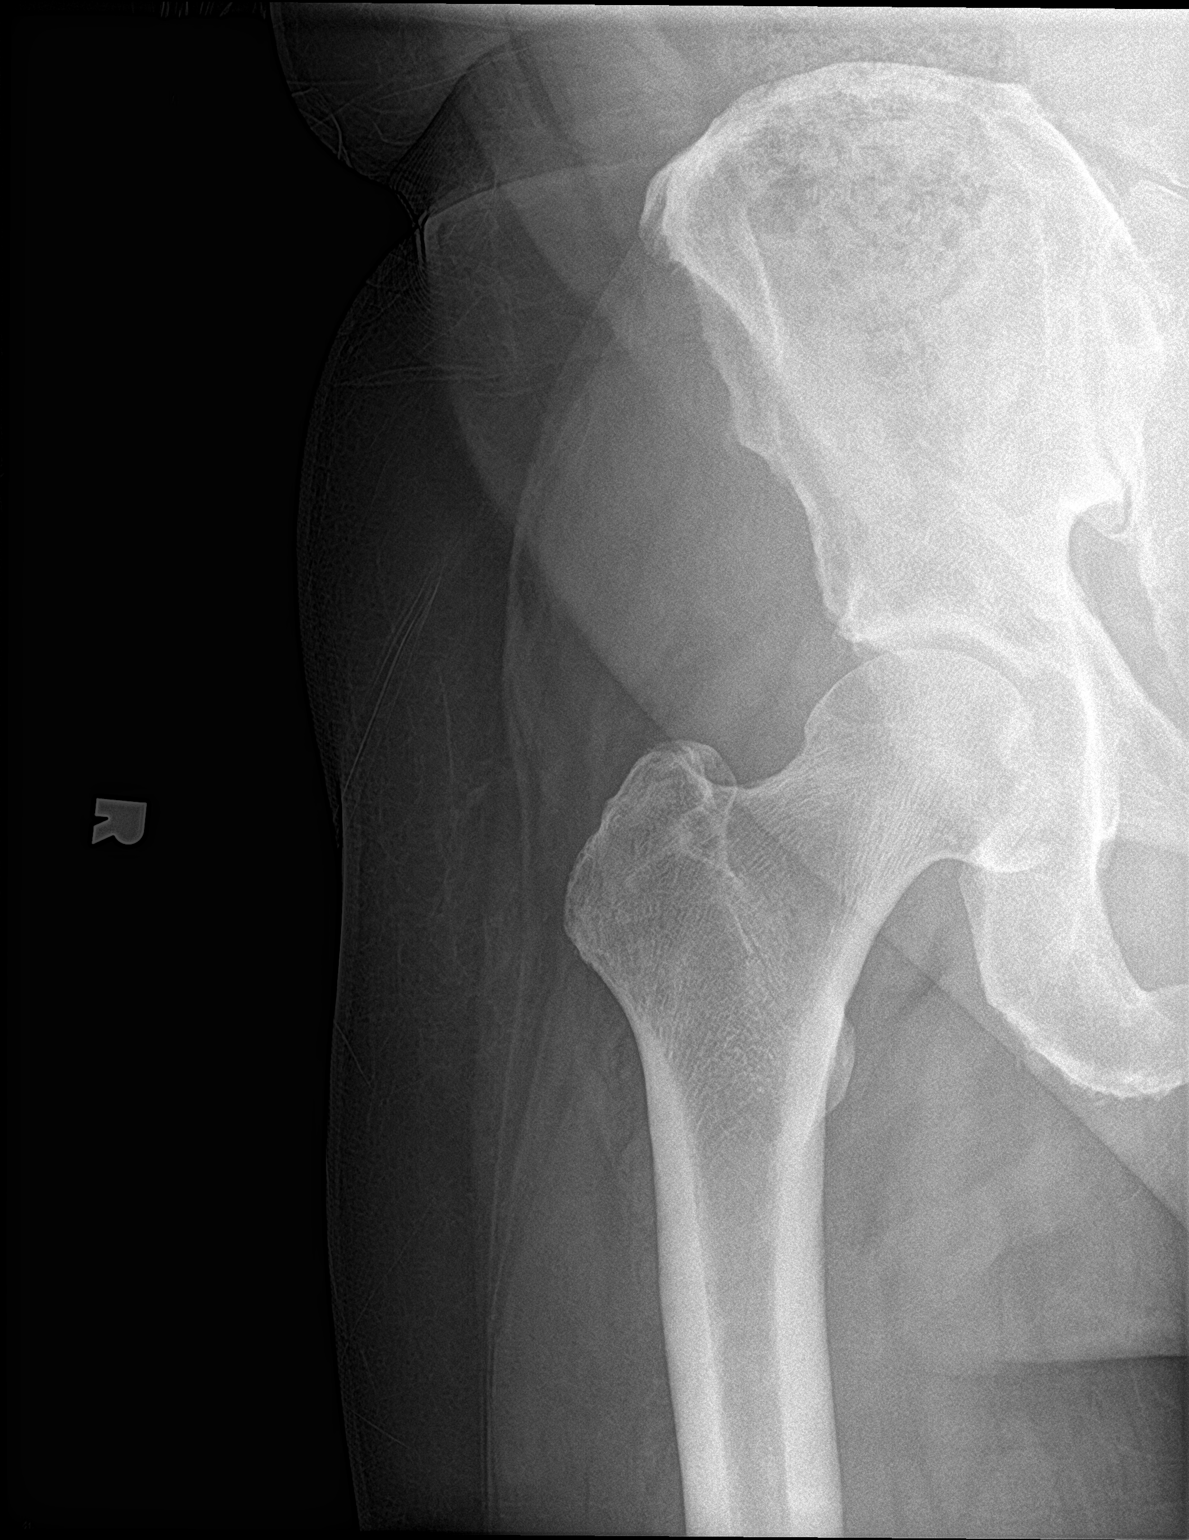

[hip lat]
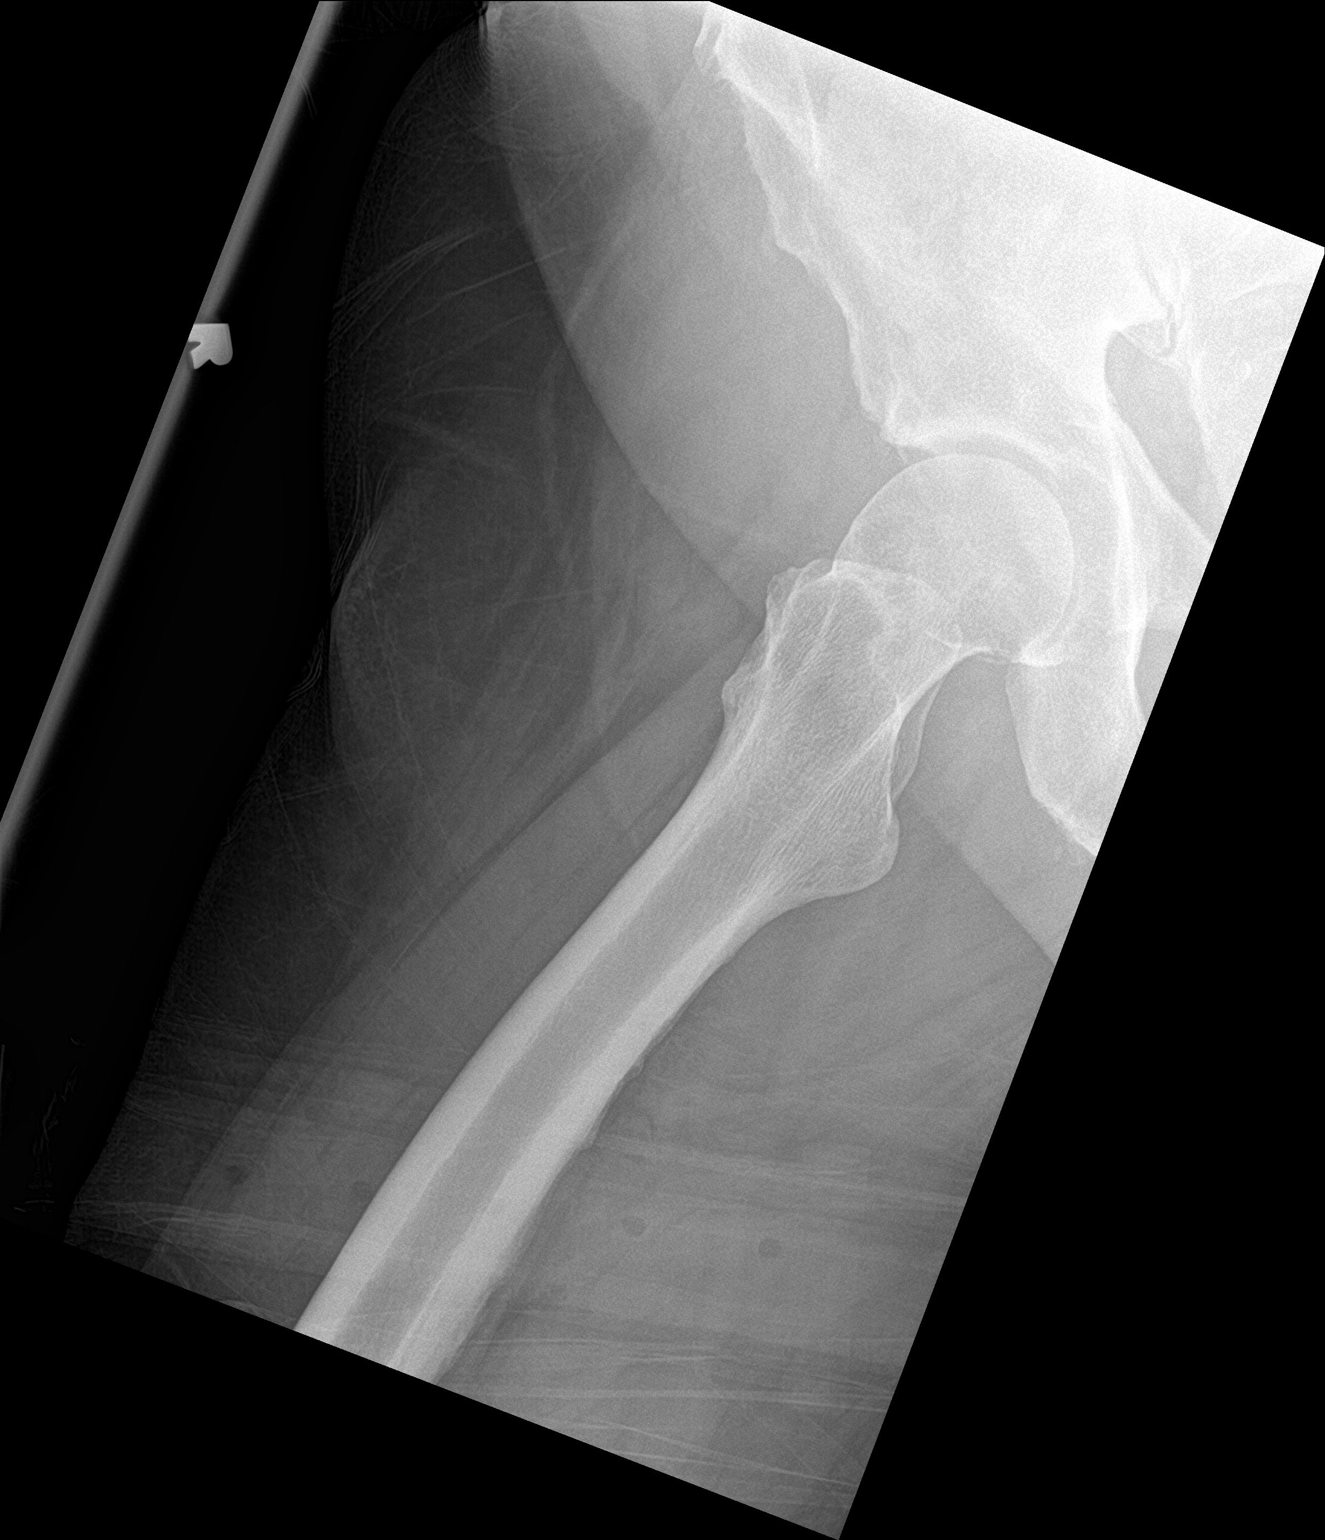

[pelvis ap]
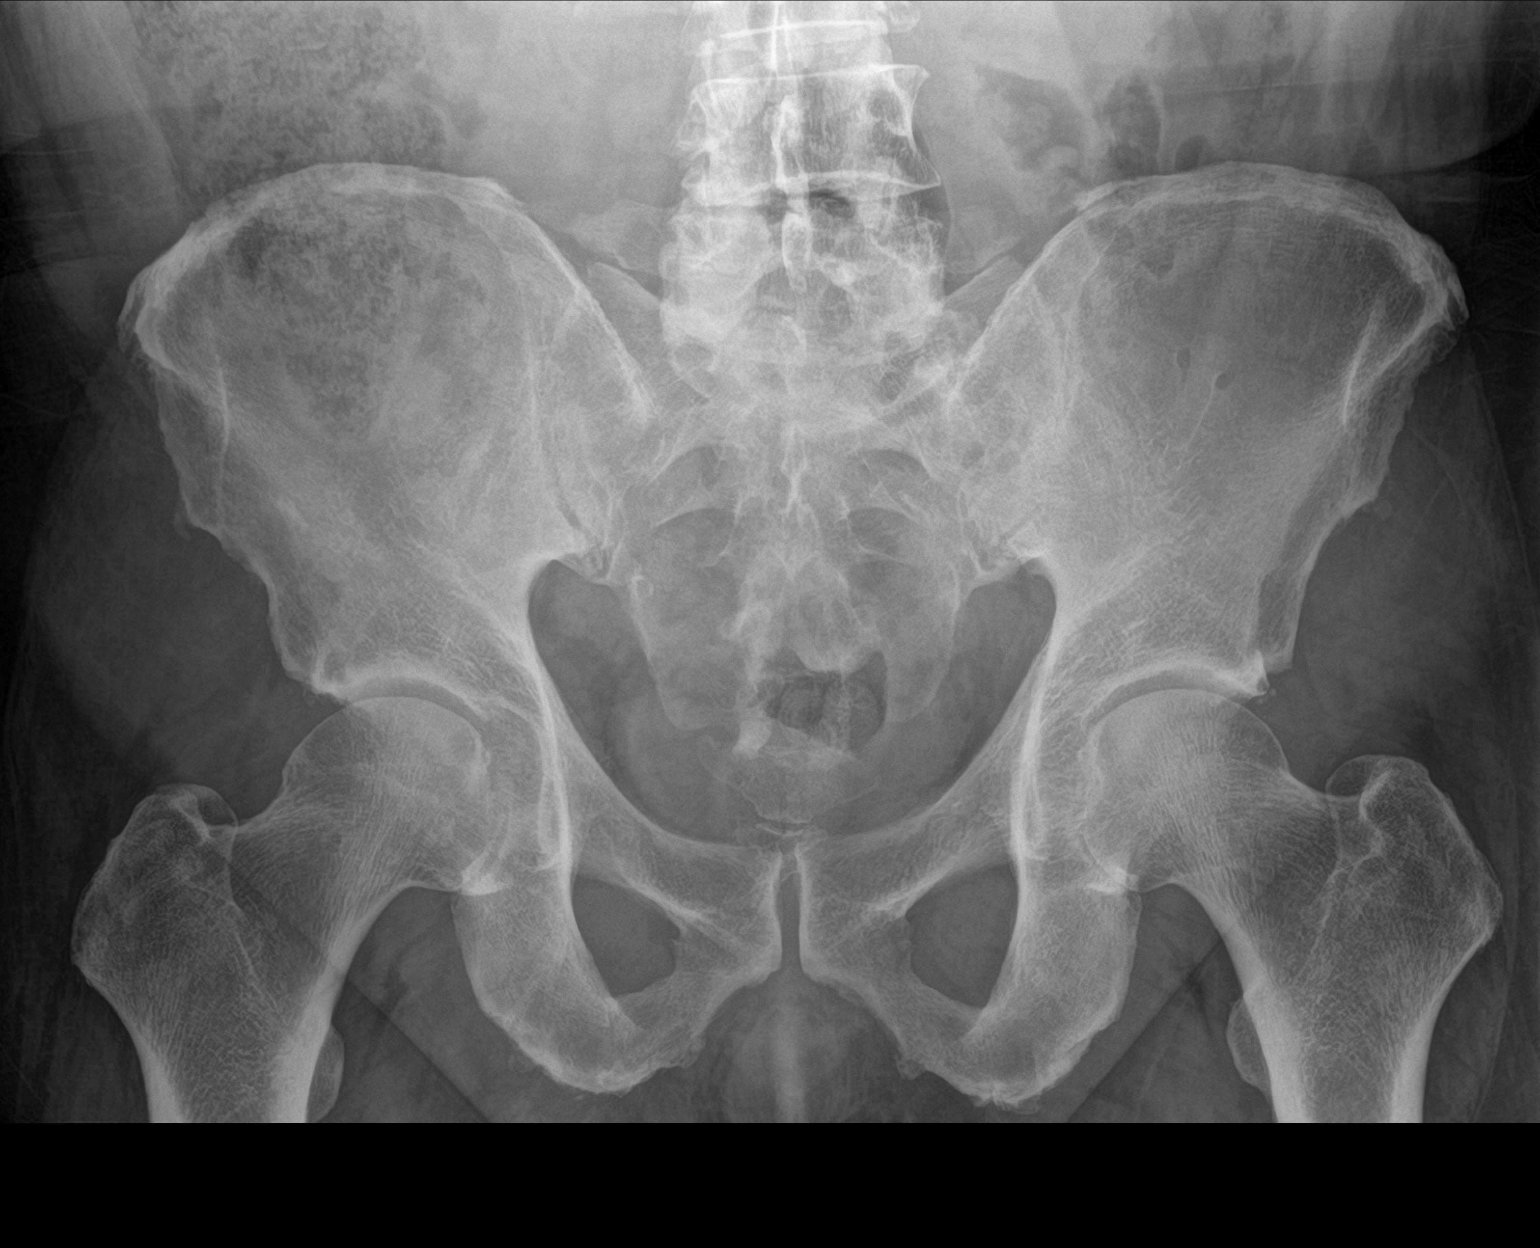

[3 of 3 positions shown; findings below may reference images not displayed]

FINDINGS: Mild degenerative change right hip. No acute bony or joint
abnormality. No evidence of fracture dislocation.
IMPRESSION: Mild degenerative change lumbar spine and both hips. No acute
abnormality identified.

## 2021-11-14 ENCOUNTER — Other Ambulatory Visit: Payer: Self-pay | Admitting: Family Medicine

## 2021-12-23 ENCOUNTER — Telehealth: Payer: Self-pay

## 2021-12-23 ENCOUNTER — Ambulatory Visit: Payer: Medicare Other

## 2021-12-23 NOTE — Progress Notes (Deleted)
Subjective:   Joseph Mayer is a 71 y.o. male who presents for Medicare Annual/Subsequent preventive examination.  I connected with [place] today by telephone and verified that I am speaking with the correct person using two identifiers. Location patient: home Location provider: work Persons participating in the virtual visit: patient, Marine scientist.    I discussed the limitations, risks, security and privacy concerns of performing an evaluation and management service by telephone and the availability of in person appointments. I also discussed with the patient that there may be a patient responsible charge related to this service. The patient expressed understanding and verbally consented to this telephonic visit.    Interactive audio and video telecommunications were attempted between this provider and patient, however failed, due to patient having technical difficulties OR patient did not have access to video capability.  We continued and completed visit with audio only.  Some vital signs may be absent or patient reported.   Time Spent with patient on telephone encounter: *** minutes   Review of Systems    ***       Objective:    There were no vitals filed for this visit. There is no height or weight on file to calculate BMI.      View : No data to display.          Current Medications (verified) Outpatient Encounter Medications as of 12/23/2021  Medication Sig   azithromycin (ZITHROMAX) 500 MG tablet Take 1 tab daily in event of traveler's diarrhea or pneumonia.   losartan-hydrochlorothiazide (HYZAAR) 100-25 MG tablet TAKE 1 TABLET BY MOUTH EVERY DAY   Multiple Vitamins-Minerals (PRESERVISION AREDS 2 PO) Take by mouth.   ondansetron (ZOFRAN-ODT) 4 MG disintegrating tablet Take 1 tablet (4 mg total) by mouth every 8 (eight) hours as needed for nausea or vomiting.   scopolamine (TRANSDERM-SCOP) 1 MG/3DAYS Place 1 patch (1.5 mg total) onto the skin every 3 (three) days.   No  facility-administered encounter medications on file as of 12/23/2021.    Allergies (verified) Patient has no known allergies.   History: Past Medical History:  Diagnosis Date   Diverticulitis    Hypertension    Migraine    Past Surgical History:  Procedure Laterality Date   APPENDECTOMY  1994   BACK SURGERY  2005   Family History  Problem Relation Age of Onset   Diabetes Mother    Early death Mother    Heart disease Mother    Stroke Mother    Early death Maternal Grandmother    Arthritis Maternal Grandfather        RA   Early death Paternal Grandfather    Social History   Socioeconomic History   Marital status: Married    Spouse name: Not on file   Number of children: Not on file   Years of education: Not on file   Highest education level: Not on file  Occupational History   Not on file  Tobacco Use   Smoking status: Never   Smokeless tobacco: Never  Substance and Sexual Activity   Alcohol use: Yes    Comment: rarely   Drug use: Never   Sexual activity: Not on file  Other Topics Concern   Not on file  Social History Narrative   Not on file   Social Determinants of Health   Financial Resource Strain: Not on file  Food Insecurity: Not on file  Transportation Needs: Not on file  Physical Activity: Not on file  Stress: Not on file  Social Connections: Not on file    Tobacco Counseling Counseling given: Not Answered   Clinical Intake:                 Diabetic?No         Activities of Daily Living    02/11/2021    8:35 AM  In your present state of health, do you have any difficulty performing the following activities:  Hearing? 0  Vision? 0  Difficulty concentrating or making decisions? 0  Walking or climbing stairs? 0  Dressing or bathing? 0  Doing errands, shopping? 0    Patient Care Team: [name], DO as PCP - General (Family Medicine)  Indicate any recent Medical Services you may have received from other  than [facility] providers in the past year (date may be approximate).     Assessment:   This is a routine wellness examination for Joseph Mayer.  Hearing/Vision screen No results found.  Dietary issues and exercise activities discussed:     Goals Addressed   None    Depression Screen    02/11/2021    8:35 AM  PHQ 2/9 Scores  PHQ - 2 Score 0    Fall Risk    02/11/2021    8:35 AM  [place] in the past year? 0  Number falls in past yr: 0  Injury with Fall? 0  Risk for fall due to : No Fall Risks  Follow up Falls evaluation completed    [place]:  Any stairs in or around the home? {YES/NO:21197} If so, are there any without handrails? {YES/NO:21197} Home free of loose throw rugs in walkways, pet beds, electrical cords, etc? {YES/NO:21197} Adequate lighting in your home to reduce risk of falls? {YES/NO:21197}  ASSISTIVE DEVICES UTILIZED TO PREVENT FALLS:  Life alert? {YES/NO:21197} Use of a cane, walker or w/c? {YES/NO:21197} Grab bars in the bathroom? {YES/NO:21197} Shower chair or bench in shower? {YES/NO:21197} Elevated toilet seat or a handicapped toilet? {YES/NO:21197}  TIMED UP AND GO:  Was the test performed? No . Phone visit   Cognitive Function:        Immunizations Immunization History  Administered Date(s) Administered   Fluad Quad(high Dose 65+) 06/01/2019   Influenza, High Dose Seasonal PF 04/16/2020   Influenza,inj,Quad PF,6+ Mos 06/04/2017   Influenza,inj,quad, With Preservative 05/30/2014, 06/18/2016   Influenza-Unspecified 04/16/2020, 06/20/2021   PFIZER(Purple Top)SARS-COV-2 Vaccination 08/21/2019, 09/11/2019, 05/14/2020   Pneumococcal Polysaccharide-23 09/10/2018   Tdap 10/20/2014   Zoster Recombinat (Shingrix) 09/26/2020   Zoster, Live 04/20/2015    TDAP status: Up to date  Flu Vaccine status: Up to date  Pneumococcal vaccine status: Due, Education has been provided regarding the importance of  this vaccine. Advised may receive this vaccine at local pharmacy or Health Dept. Aware to provide a copy of the vaccination record if obtained from local pharmacy or Health Dept. Verbalized acceptance and understanding.  Covid-19 vaccine status: Information provided on how to obtain vaccines.   Qualifies for Shingles Vaccine? {YES/NO:21197}  Zostavax completed {YES/NO:21197}  {Shingrix Completed?:2101804}  Screening Tests Health Maintenance  Topic Date Due   Pneumonia Vaccine 39+ Years old (2 - PCV) 09/11/2019   COVID-19 Vaccine (4 - Booster for Pfizer series) 07/09/2020   Zoster Vaccines- Shingrix (2 of 2) 11/21/2020   INFLUENZA VACCINE  02/18/2022   COLONOSCOPY (Pts 45-70yr Insurance coverage will need to be confirmed)  10/16/2024   TETANUS/TDAP  10/19/2024   Hepatitis C Screening  Completed  HPV VACCINES  Aged Out    Health Maintenance  Health Maintenance Due  Topic Date Due   Pneumonia Vaccine 96+ Years old (2 - PCV) 09/11/2019   COVID-19 Vaccine (4 - Booster for Pfizer series) 07/09/2020   Zoster Vaccines- Shingrix (2 of 2) 11/21/2020    Colorectal cancer screening: Type of screening: Colonoscopy. Completed 10/17/2014. Repeat every 10 years  Lung Cancer Screening: (Low Dose CT Chest recommended if Age 32-80 years, 30 pack-year currently smoking OR have quit w/in 15years.) does not qualify.     Additional Screening:  Hepatitis C Screening:  Completed 03/25/2017  Vision Screening: Recommended annual ophthalmology exams for early detection of glaucoma and other disorders of the eye. Is the patient up to date with their annual eye exam?  {YES/NO:21197} Who is the provider or what is the name of the office in which the patient attends annual eye exams? *** If pt is not established with a provider, would they like to be referred to a provider to establish care? {YES/NO:21197}.   Dental Screening: Recommended annual dental exams for proper oral hygiene  Community Resource  Referral / Chronic Care Management: CRR required this visit?  {YES/NO:21197}  CCM required this visit?  {YES/NO:21197}     Plan:     I have personally reviewed and noted the following in the patient's chart:   Medical and social history Use of alcohol, tobacco or illicit drugs  Current medications and supplements including opioid prescriptions. {Opioid [phone]} Functional ability and status Nutritional status Physical activity Advanced directives List of other physicians Hospitalizations, surgeries, and ER visits in previous 12 months Vitals Screenings to include cognitive, depression, and falls Referrals and appointments  In addition, I have reviewed and discussed with patient certain preventive protocols, quality metrics, and best practice recommendations. A written personalized care plan for preventive services as well as general preventive health recommendations were provided to patient.   Due to this being a telephonic visit, the after visit summary with patients personalized plan was offered to patient via mail or my-chart. Patient would like to access on my-chart.   Marta Antu, LPN   07/22/4578  Nurse Health Advisor  Nurse Notes: ***

## 2021-12-23 NOTE — Telephone Encounter (Signed)
Patient had a 1:00  phone visit scheduled for his Medicare Wellness visit today. Attempted to reach patient x 3. Left message to call back.

## 2022-01-14 ENCOUNTER — Encounter (INDEPENDENT_AMBULATORY_CARE_PROVIDER_SITE_OTHER): Payer: Medicare Other | Admitting: Ophthalmology

## 2022-01-14 DIAGNOSIS — H353121 Nonexudative age-related macular degeneration, left eye, early dry stage: Secondary | ICD-10-CM | POA: Diagnosis not present

## 2022-01-14 DIAGNOSIS — I10 Essential (primary) hypertension: Secondary | ICD-10-CM

## 2022-01-14 DIAGNOSIS — H33301 Unspecified retinal break, right eye: Secondary | ICD-10-CM | POA: Diagnosis not present

## 2022-01-14 DIAGNOSIS — H35033 Hypertensive retinopathy, bilateral: Secondary | ICD-10-CM | POA: Diagnosis not present

## 2022-01-14 DIAGNOSIS — H43813 Vitreous degeneration, bilateral: Secondary | ICD-10-CM

## 2022-01-14 DIAGNOSIS — H353211 Exudative age-related macular degeneration, right eye, with active choroidal neovascularization: Secondary | ICD-10-CM | POA: Diagnosis not present

## 2022-05-13 ENCOUNTER — Encounter (INDEPENDENT_AMBULATORY_CARE_PROVIDER_SITE_OTHER): Payer: Medicare Other | Admitting: Ophthalmology

## 2022-05-13 DIAGNOSIS — I10 Essential (primary) hypertension: Secondary | ICD-10-CM

## 2022-05-13 DIAGNOSIS — H353211 Exudative age-related macular degeneration, right eye, with active choroidal neovascularization: Secondary | ICD-10-CM | POA: Diagnosis not present

## 2022-05-13 DIAGNOSIS — H33301 Unspecified retinal break, right eye: Secondary | ICD-10-CM | POA: Diagnosis not present

## 2022-05-13 DIAGNOSIS — H353121 Nonexudative age-related macular degeneration, left eye, early dry stage: Secondary | ICD-10-CM

## 2022-05-13 DIAGNOSIS — H43813 Vitreous degeneration, bilateral: Secondary | ICD-10-CM

## 2022-05-13 DIAGNOSIS — H35033 Hypertensive retinopathy, bilateral: Secondary | ICD-10-CM

## 2022-05-20 DIAGNOSIS — H40051 Ocular hypertension, right eye: Secondary | ICD-10-CM | POA: Diagnosis not present

## 2022-05-20 DIAGNOSIS — H353121 Nonexudative age-related macular degeneration, left eye, early dry stage: Secondary | ICD-10-CM | POA: Diagnosis not present

## 2022-05-20 DIAGNOSIS — H353211 Exudative age-related macular degeneration, right eye, with active choroidal neovascularization: Secondary | ICD-10-CM | POA: Diagnosis not present

## 2022-05-20 DIAGNOSIS — H25813 Combined forms of age-related cataract, bilateral: Secondary | ICD-10-CM | POA: Diagnosis not present

## 2022-05-27 DIAGNOSIS — Z23 Encounter for immunization: Secondary | ICD-10-CM | POA: Diagnosis not present

## 2022-07-04 ENCOUNTER — Encounter: Payer: Self-pay | Admitting: Family Medicine

## 2022-07-04 ENCOUNTER — Ambulatory Visit (INDEPENDENT_AMBULATORY_CARE_PROVIDER_SITE_OTHER): Payer: Medicare Other | Admitting: Family Medicine

## 2022-07-04 VITALS — BP 132/82 | HR 89 | Temp 98.2°F | Ht 68.0 in | Wt 237.1 lb

## 2022-07-04 DIAGNOSIS — Z23 Encounter for immunization: Secondary | ICD-10-CM | POA: Diagnosis not present

## 2022-07-04 DIAGNOSIS — S46812A Strain of other muscles, fascia and tendons at shoulder and upper arm level, left arm, initial encounter: Secondary | ICD-10-CM

## 2022-07-04 MED ORDER — TIZANIDINE HCL 4 MG PO TABS: 4.0000 mg | ORAL_TABLET | Freq: Four times a day (QID) | ORAL | 0 refills | Status: DC | PRN

## 2022-07-04 NOTE — Progress Notes (Signed)
Musculoskeletal Exam  Patient: Joseph Mayer DOB: Aug 22, 1950  DOS: 07/04/2022  SUBJECTIVE:  Chief Complaint:   Chief Complaint  Patient presents with   Shoulder Pain    Neck pain for 6 months    Joseph Mayer is a 71 y.o.  male for evaluation and treatment of L shoulder/neck pain.   Onset:  6 months ago initially, most recently 3-4 weeks ago. No inj or change in activity.  Location: Trap region on L Character:  sharp  Worse when he sits for long periods of time.  Progression of issue:  is unchanged Associated symptoms: numbness, tingling down LUE Some decreased ROM 2/2 pain. No redness, swelling, bruising.  Treatment: to date has been [employer], Tylenol.   Neurovascular symptoms: no weakness  Past Medical History:  Diagnosis Date   Diverticulitis    Hypertension    Migraine     Objective: VITAL SIGNS: BP 132/82 (BP Location: Left Arm, Cuff Size: Large)   Pulse 89   Temp 98.2 F (36.8 C) (Oral)   Ht '5\' 8"'$  (1.727 m)   Wt 237 lb 2 oz (107.6 kg)   SpO2 98%   BMI 36.05 kg/m  Constitutional: Well formed, well developed. No acute distress. Thorax & Lungs: No accessory muscle use Musculoskeletal: L shoulder.   Normal active range of motion: yes.   Normal passive range of motion: yes Tenderness to palpation: yes over anterior L trap Deformity: no Ecchymosis: no Tests positive: none Tests negative: Spurling's Neurologic: Normal sensory function. No focal deficits noted. DTR's equal and symmetric in UE's. No clonus. Psychiatric: Normal mood. Age appropriate judgment and insight. Alert & oriented x 3.    Assessment:  Strain of left trapezius muscle, initial encounter - Plan: tiZANidine (ZANAFLEX) 4 MG tablet  Need for vaccination against Streptococcus pneumoniae - Plan: Pneumococcal conjugate vaccine 20-valent (Prevnar 20)  Plan: Stretches/exercises, heat, ice, Tylenol.  F/u prn. The patient voiced understanding and agreement to the plan.   [place], DO 07/04/22  1:44 PM

## 2022-07-04 NOTE — Patient Instructions (Addendum)
Ice/cold pack over area for 10-15 min twice daily.  Heat (pad or rice pillow in microwave) over affected area, 10-15 minutes twice daily.   OK to take Tylenol 1000 mg (2 extra strength tabs) or 975 mg (3 regular strength tabs) every 6 hours as needed.  Send me a message in 3-4 weeks if we aren't turning the corner.   Let us know if you need anything.  Trapezius stretches/exercises Do exercises exactly as told by your health care provider and adjust them as directed. It is normal to feel mild stretching, pulling, tightness, or discomfort as you do these exercises, but you should stop right away if you feel sudden pain or your pain gets worse.   Stretching and range of motion exercises These exercises warm up your muscles and joints and improve the movement and flexibility of your shoulder. These exercises can also help to relieve pain, numbness, and tingling. If you are unable to do any of the following for any reason, do not further attempt to do it.   Exercise A: Flexion, standing     Stand and hold a broomstick, a cane, or a similar object. Place your hands a little more than shoulder-width apart on the object. Your left / right hand should be palm-up, and your other hand should be palm-down. Push the stick to raise your left / right arm out to your side and then over your head. Use your other hand to help move the stick. Stop when you feel a stretch in your shoulder, or when you reach the angle that is recommended by your health care provider. Avoid shrugging your shoulder while you raise your arm. Keep your shoulder blade tucked down toward your spine. Hold for 30 seconds. Slowly return to the starting position. Repeat 2 times. Complete this exercise 3 times per week.  Exercise B: Abduction, supine     Lie on your back and hold a broomstick, a cane, or a similar object. Place your hands a little more than shoulder-width apart on the object. Your left / right hand should be palm-up,  and your other hand should be palm-down. Push the stick to raise your left / right arm out to your side and then over your head. Use your other hand to help move the stick. Stop when you feel a stretch in your shoulder, or when you reach the angle that is recommended by your health care provider. Avoid shrugging your shoulder while you raise your arm. Keep your shoulder blade tucked down toward your spine. Hold for 30 seconds. Slowly return to the starting position. Repeat 2 times. Complete this exercise 3 times per week.  Exercise C: Flexion, active-assisted     Lie on your back. You may bend your knees for comfort. Hold a broomstick, a cane, or a similar object. Place your hands about shoulder-width apart on the object. Your palms should face toward your feet. Raise the stick and move your arms over your head and behind your head, toward the floor. Use your healthy arm to help your left / right arm move farther. Stop when you feel a gentle stretch in your shoulder, or when you reach the angle where your health care provider tells you to stop. Hold for 30 seconds. Slowly return to the starting position. Repeat 2 times. Complete this exercise 3 times per week.  Exercise D: External rotation and abduction     Stand in a door frame with one of your feet slightly in front of the  other. This is called a staggered stance. Choose one of the following positions as told by your health care provider: Place your hands and forearms on the door frame above your head. Place your hands and forearms on the door frame at the height of your head. Place your hands on the door frame at the height of your elbows. Slowly move your weight onto your front foot until you feel a stretch across your chest and in the front of your shoulders. Keep your head and chest upright and keep your abdominal muscles tight. Hold for 30 seconds. To release the stretch, shift your weight to your back foot. Repeat 2 times.  Complete this stretch 3 times per week.  Strengthening exercises These exercises build strength and endurance in your shoulder. Endurance is the ability to use your muscles for a long time, even after your muscles get tired. Exercise E: Scapular depression and adduction  Sit on a stable chair. Support your arms in front of you with pillows, armrests, or a tabletop. Keep your elbows in line with the sides of your body. Gently move your shoulder blades down toward your middle back. Relax the muscles on the tops of your shoulders and in the back of your neck. Hold for 3 seconds. Slowly release the tension and relax your muscles completely before doing this exercise again. Repeat for a total of 10 repetitions. After you have practiced this exercise, try doing the exercise without the arm support. Then, try the exercise while standing instead of sitting. Repeat 2 times. Complete this exercise 3 times per week.  Exercise F: Shoulder abduction, isometric     Stand or sit about 4-6 inches (10-15 cm) from a wall with your left / right side facing the wall. Bend your left / right elbow and gently press your elbow against the wall. Increase the pressure slowly until you are pressing as hard as you can without shrugging your shoulder. Hold for 3 seconds. Slowly release the tension and relax your muscles completely. Repeat for a total of 10 repetitions. Repeat 2 times. Complete this exercise 3 times per week.  Exercise G: Shoulder flexion, isometric     Stand or sit about 4-6 inches (10-15 cm) away from a wall with your left / right side facing the wall. Keep your left / right elbow straight and gently press the top of your fist against the wall. Increase the pressure slowly until you are pressing as hard as you can without shrugging your shoulder. Hold for 10-15 seconds. Slowly release the tension and relax your muscles completely. Repeat for a total of 10 repetitions. Repeat 2 times. Complete this  exercise 3 times per week.  Exercise H: Internal rotation     Sit in a stable chair without armrests, or stand. Secure an exercise band at your left / right side, at elbow height. Place a soft object, such as a folded towel or a small pillow, under your left / right upper arm so your elbow is a few inches (about 8 cm) away from your side. Hold the end of the exercise band so the band stretches. Keeping your elbow pressed against the soft object under your arm, move your forearm across your body toward your abdomen. Keep your body steady so the movement is only coming from your shoulder. Hold for 3 seconds. Slowly return to the starting position. Repeat for a total of 10 repetitions. Repeat 2 times. Complete this exercise 3 times per week.  Exercise I: External rotation  Sit in a stable chair without armrests, or stand. Secure an exercise band at your left / right side, at elbow height. Place a soft object, such as a folded towel or a small pillow, under your left / right upper arm so your elbow is a few inches (about 8 cm) away from your side. Hold the end of the exercise band so the band stretches. Keeping your elbow pressed against the soft object under your arm, move your forearm out, away from your abdomen. Keep your body steady so the movement is only coming from your shoulder. Hold for 3 seconds. Slowly return to the starting position. Repeat for a total of 10 repetitions. Repeat 2 times. Complete this exercise 3 times per week. Exercise J: Shoulder extension  Sit in a stable chair without armrests, or stand. Secure an exercise band to a stable object in front of you so the band is at shoulder height. Hold one end of the exercise band in each hand. Your palms should face each other. Straighten your elbows and lift your hands up to shoulder height. Step back, away from the secured end of the exercise band, until the band stretches. Squeeze your shoulder blades together and pull  your hands down to the sides of your thighs. Stop when your hands are straight down by your sides. Do not let your hands go behind your body. Hold for 3 seconds. Slowly return to the starting position. Repeat for a total of 10 repetitions. Repeat 2 times. Complete this exercise 3 times per week.  Exercise K: Shoulder extension, prone     Lie on your abdomen on a firm surface so your left / right arm hangs over the edge. Hold a 5 lb weight in your hand so your palm faces in toward your body. Your arm should be straight. Squeeze your shoulder blade down toward the middle of your back. Slowly raise your arm behind you, up to the height of the surface that you are lying on. Keep your arm straight. Hold for 3 seconds. Slowly return to the starting position and relax your muscles. Repeat for a total of 10 repetitions. Repeat 2 times. Complete this exercise 3 times per week.   Exercise L: Horizontal abduction, prone  Lie on your abdomen on a firm surface so your left / right arm hangs over the edge. Hold a 5 lb weight in your hand so your palm faces toward your feet. Your arm should be straight. Squeeze your shoulder blade down toward the middle of your back. Bend your elbow so your hand moves up, until your elbow is bent to an "L" shape (90 degrees). With your elbow bent, slowly move your forearm forward and up. Raise your hand up to the height of the surface that you are lying on. Your upper arm should not move, and your elbow should stay bent. At the top of the movement, your palm should face the floor. Hold for 3 seconds. Slowly return to the starting position and relax your muscles. Repeat for a total of 10 repetitions. Repeat 2 times. Complete this exercise 3 times per week.  Exercise M: Horizontal abduction, standing  Sit on a stable chair, or stand. Secure an exercise band to a stable object in front of you so the band is at shoulder height. Hold one end of the exercise band in each  hand. Straighten your elbows and lift your hands straight in front of you, up to shoulder height. Your palms should face down, toward  the floor. Step back, away from the secured end of the exercise band, until the band stretches. Move your arms out to your sides, and keep your arms straight. Hold for 3 seconds. Slowly return to the starting position. Repeat for a total of 10 repetitions. Repeat 2 times. Complete this exercise 3 times per week.  Exercise N: Scapular retraction and elevation  Sit on a stable chair, or stand. Secure an exercise band to a stable object in front of you so the band is at shoulder height. Hold one end of the exercise band in each hand. Your palms should face each other. Sit in a stable chair without armrests, or stand. Step back, away from the secured end of the exercise band, until the band stretches. Squeeze your shoulder blades together and lift your hands over your head. Keep your elbows straight. Hold for 3 seconds. Slowly return to the starting position. Repeat for a total of 10 repetitions. Repeat 2 times. Complete this exercise 3 times per week.  This information is not intended to replace advice given to you by your health care provider. Make sure you discuss any questions you have with your health care provider. Document Released: 07/07/2005 Document Revised: 03/13/2016 Document Reviewed: 05/24/2015 Elsevier Interactive Patient Education  2017 [employer].

## 2022-07-22 ENCOUNTER — Encounter: Payer: Self-pay | Admitting: Family Medicine

## 2022-07-22 ENCOUNTER — Other Ambulatory Visit: Payer: Self-pay | Admitting: Family Medicine

## 2022-07-22 DIAGNOSIS — S46812A Strain of other muscles, fascia and tendons at shoulder and upper arm level, left arm, initial encounter: Secondary | ICD-10-CM

## 2022-07-29 ENCOUNTER — Encounter: Payer: Self-pay | Admitting: Family Medicine

## 2022-08-11 NOTE — Therapy (Signed)
OUTPATIENT PHYSICAL THERAPY UPPER EXTREMITY EVALUATION   Patient Name: Joseph Mayer MRN: [id number] DOB:[birth year], 72 y.o., male Today's Date: 08/11/2022  END OF SESSION:   Past Medical History:  Diagnosis Date   Diverticulitis    Hypertension    Migraine    Past Surgical History:  Procedure Laterality Date   APPENDECTOMY  1994   BACK SURGERY  2005   Patient Active Problem List   Diagnosis Date Noted   Greater trochanteric pain syndrome of right lower extremity 02/13/2020   Primary osteoarthritis of right knee 01/11/2020   Essential hypertension 09/10/2018    PCP:    REFERRING PROVIDER: [place], [place]: [phone] (ICD-10-CM) - Strain of left trapezius muscle, initial encounter   THERAPY DIAG:  No diagnosis found.  Rationale for Evaluation and Treatment: Rehabilitation  ONSET DATE: ***  NEXT MD VISIT:   SUBJECTIVE:                                                                                                                                                                                      SUBJECTIVE STATEMENT: ***  PERTINENT HISTORY: HTN, Back sx 2005, Knee OA   PAIN:  Are you having pain? {OPRCPAIN:27236}  PRECAUTIONS: {Therapy precautions:24002}  WEIGHT BEARING RESTRICTIONS: {Yes ***/No:24003}  FALLS:  Has patient fallen in last 6 months? {fallsyesno:27318}  LIVING ENVIRONMENT: Lives with: {[facility] lives with:25569::"lives with their family"} Lives in: {Lives in:25570} Stairs: {opstairs:27293} Has following equipment at home: {Assistive devices:23999}  OCCUPATION: ***  PLOF: {PLOF:24004}  PATIENT GOALS: ***  OBJECTIVE:   DIAGNOSTIC FINDINGS:  N/A   PATIENT SURVEYS :  {rehab surveys:24030:a}  COGNITION: Overall cognitive status: {cognition:24006}     SENSATION: {sensation:27233}  POSTURE: ***  UPPER EXTREMITY ROM:   Active ROM Right eval Left eval  Shoulder flexion    Shoulder extension     Shoulder abduction    Shoulder adduction    Shoulder internal rotation    Shoulder external rotation    Elbow flexion    Elbow extension    Wrist flexion    Wrist extension    Wrist ulnar deviation    Wrist radial deviation    Wrist pronation    Wrist supination    (Blank rows = not tested)  UPPER EXTREMITY MMT:  MMT Right eval Left eval  Shoulder flexion    Shoulder extension    Shoulder abduction    Shoulder adduction    Shoulder internal rotation    Shoulder external rotation    Middle trapezius    Lower trapezius    Elbow flexion    Elbow extension    Wrist flexion  Wrist extension    Wrist ulnar deviation    Wrist radial deviation    Wrist pronation    Wrist supination    Grip strength (lbs)    (Blank rows = not tested)  SHOULDER SPECIAL TESTS: Impingement tests: {shoulder impingement test:25231:a} SLAP lesions: {SLAP lesions:25232} Instability tests: {shoulder instability test:25233} Rotator cuff assessment: {rotator cuff assessment:25234} Biceps assessment: {biceps assessment:25235}  JOINT MOBILITY TESTING:  ***  PALPATION:  ***   TODAY'S TREATMENT:                                                                                                                                         DATE: ***  08/13/22 Initial Eval    PATIENT EDUCATION: Education details: *** Person educated: {Person educated:25204} Education method: {Education Method:25205} Education comprehension: {Education Comprehension:25206}  HOME EXERCISE PROGRAM: ***  ASSESSMENT:  CLINICAL IMPRESSION: Patient is a 72 y.o. male who was seen today for physical therapy evaluation and treatment for ***.    OBJECTIVE IMPAIRMENTS: {opptimpairments:25111}.   ACTIVITY LIMITATIONS: {activitylimitations:27494}  PARTICIPATION LIMITATIONS: {participationrestrictions:25113}  PERSONAL FACTORS: {Personal factors:25162} are also affecting patient's functional outcome.   REHAB  POTENTIAL: {rehabpotential:25112}  CLINICAL DECISION MAKING: {clinical decision making:25114}  EVALUATION COMPLEXITY: {Evaluation complexity:25115}  GOALS: Goals reviewed with patient? {yes/no:20286}  SHORT TERM GOALS: Target date: ***  *** Baseline: Goal status: INITIAL  2.  *** Baseline:  Goal status: INITIAL  3.  *** Baseline:  Goal status: INITIAL  4.  *** Baseline:  Goal status: {GOALSTATUS:25110}  5.  *** Baseline:  Goal status: {GOALSTATUS:25110}  6.  *** Baseline:  Goal status: {GOALSTATUS:25110}  LONG TERM GOALS: Target date: ***  Pt will be independent with HEP to increase independence and decrease pain.  Baseline:  Goal status: INITIAL  2.  *** Baseline:  Goal status: INITIAL  3.  *** Baseline:  Goal status: INITIAL  4.  *** Baseline:  Goal status: {GOALSTATUS:25110}  5.  *** Baseline:  Goal status: {GOALSTATUS:25110}  6.  *** Baseline:  Goal status: {GOALSTATUS:25110}  PLAN: PT FREQUENCY: {rehab frequency:25116}  PT DURATION: {rehab duration:25117}  PLANNED INTERVENTIONS: {rehab planned interventions:25118::"Therapeutic exercises","Therapeutic activity","Neuromuscular re-education","Balance training","Gait training","Patient/Family education","Self Care","Joint mobilization"}  PLAN FOR NEXT SESSION: ***   Tametria Aho Romero-Perozo, Student-PT 08/11/2022, 3:19 PM

## 2022-08-13 ENCOUNTER — Ambulatory Visit: Payer: Medicare Other | Attending: Family Medicine | Admitting: Physical Therapy

## 2022-08-13 ENCOUNTER — Encounter: Payer: Self-pay | Admitting: Physical Therapy

## 2022-08-13 ENCOUNTER — Other Ambulatory Visit: Payer: Self-pay

## 2022-08-13 DIAGNOSIS — M5412 Radiculopathy, cervical region: Secondary | ICD-10-CM | POA: Diagnosis not present

## 2022-08-13 DIAGNOSIS — G8929 Other chronic pain: Secondary | ICD-10-CM | POA: Diagnosis not present

## 2022-08-13 DIAGNOSIS — M542 Cervicalgia: Secondary | ICD-10-CM | POA: Insufficient documentation

## 2022-08-13 DIAGNOSIS — M25512 Pain in left shoulder: Secondary | ICD-10-CM | POA: Insufficient documentation

## 2022-08-13 DIAGNOSIS — R293 Abnormal posture: Secondary | ICD-10-CM | POA: Diagnosis not present

## 2022-08-13 DIAGNOSIS — S46812A Strain of other muscles, fascia and tendons at shoulder and upper arm level, left arm, initial encounter: Secondary | ICD-10-CM | POA: Insufficient documentation

## 2022-08-15 ENCOUNTER — Ambulatory Visit: Payer: Medicare Other | Admitting: Physical Therapy

## 2022-08-15 ENCOUNTER — Encounter: Payer: Self-pay | Admitting: Physical Therapy

## 2022-08-15 DIAGNOSIS — M542 Cervicalgia: Secondary | ICD-10-CM | POA: Diagnosis not present

## 2022-08-15 DIAGNOSIS — G8929 Other chronic pain: Secondary | ICD-10-CM

## 2022-08-15 DIAGNOSIS — M5412 Radiculopathy, cervical region: Secondary | ICD-10-CM | POA: Diagnosis not present

## 2022-08-15 DIAGNOSIS — R293 Abnormal posture: Secondary | ICD-10-CM | POA: Diagnosis not present

## 2022-08-15 DIAGNOSIS — M25512 Pain in left shoulder: Secondary | ICD-10-CM | POA: Diagnosis not present

## 2022-08-15 DIAGNOSIS — S46812A Strain of other muscles, fascia and tendons at shoulder and upper arm level, left arm, initial encounter: Secondary | ICD-10-CM | POA: Diagnosis not present

## 2022-08-15 NOTE — Therapy (Signed)
OUTPATIENT PHYSICAL THERAPY TREATMENT   Patient Name: Joseph Mayer MRN: [id number] DOB:26-May-1951, 72 y.o., male Today's Date: 08/15/2022  END OF SESSION:  PT End of Session - 08/15/22 0843     Visit Number 2    Date for PT Re-Evaluation 09/10/22    Authorization Type Medicare & [employer]    PT Start Time [phone]    PT Stop Time 0932    PT Time Calculation (min) 49 min    Activity Tolerance Patient tolerated treatment well    Behavior During Therapy [facility] for tasks assessed/performed             Past Medical History:  Diagnosis Date   Diverticulitis    Hypertension    Migraine    Past Surgical History:  Procedure Laterality Date   APPENDECTOMY  1994   BACK SURGERY  2005   Patient Active Problem List   Diagnosis Date Noted   Greater trochanteric pain syndrome of right lower extremity 02/13/2020   Primary osteoarthritis of right knee 01/11/2020   Essential hypertension 09/10/2018    PCP:  Joseph Pal DO  REFERRING PROVIDER: [name] DO  REFERRING DIAG: [phone] (ICD-10-CM) - Strain of left trapezius muscle, initial encounter   THERAPY DIAG:  Cervicalgia  Radiculopathy, cervical region  Chronic left shoulder pain  Abnormal posture  RATIONALE FOR EVALUATION AND TREATMENT: Rehabilitation  ONSET DATE: a year ago in traps lifting weight with bar   NEXT MD VISIT: none at this time    SUBJECTIVE:                                                                                                                                                                                      SUBJECTIVE STATEMENT: Pt reports increased shoulder pain with initial performance of HEP, but states after 10-15 minutes the pain subsided to the lowest level it has been in a while.  PERTINENT HISTORY: HTN, Back sx 2005, Knee OA   PAIN:  Are you having pain? Yes: NPRS scale: 3/10 Pain location: L shoulder  Pain description: achy in shoulder;  tingling in the L forearm Aggravating factors: driving, sitting  Relieving factors: stretches to trap, ice/hot, icy hot   PRECAUTIONS: None  WEIGHT BEARING RESTRICTIONS: No  FALLS:  Has patient fallen in last 6 months? No  LIVING ENVIRONMENT: Lives with: lives with their family and lives with their spouse Lives in: House/apartment Stairs: Yes: External: 1 steps; can reach both Has following equipment at home: None  OCCUPATION: Retired   PLOF: Independent  PATIENT GOALS: get rid of pain, back to normal weight lifting routine    OBJECTIVE: (objective measures  completed at initial evaluation unless otherwise dated)   DIAGNOSTIC FINDINGS:  N/A   PATIENT SURVEYS :  NDI 8/50  COGNITION: Overall cognitive status: Within functional limits for tasks assessed     SENSATION: N/T on middle of L arm & hand   POSTURE: Rounded shoulders, slumped sitting posture  CERVICAL ROM:   Active ROM AROM (deg) eval  Flexion WNL  Extension 35  Right lateral flexion 25  Left lateral flexion WNL:45  Right rotation 48  Left rotation WNL   (Blank rows = not tested)  CERVICAL MMT: GROSSLY 5/5 FOR ALL   UPPER EXTREMITY ROM: GROSSLY WNL   UPPER EXTREMITY MMT: GROSSLY 5/5 FOR ALL  CERVICAL SPECIAL TESTS:  Upper limb tension test (ULTT): Negative, Spurling's test: Negative, and Distraction test: Positive  SHOULDER SPECIAL TESTS: SLAP lesions: Biceps load test: negative  JOINT MOBILITY TESTING:  08/15/22 - C7/C6 slight tenderness around the area but seems to be more of the musculature tenderness rather than joint. Cervical spine generally slightly stiff but pt denies any pain or discomfort.   PALPATION:  Slight tenderness on L trap     TODAY'S TREATMENT:                                                                                                                                         DATE:   08/15/22 THERAPEUTIC EXERCISE: to improve flexibility, strength and mobility.   Verbal and tactile cues throughout for technique.  UBE - L2.0 x 6 min (3' each fwd and back) Supine cervical retraction 10 x 5" Seated cervical extension AROM with pillowcase 10 x 3" Seated lower-mid cervical extension SNAG with pillowcase 10 x 3" Seated L UT stretch x 30" Seated L LS stretch x 30" Scapular retraction 10 x 5" Seated GTB row + scap retraction 10 x 5" - pt having difficulty not shrugging his shoulders Standing GTB scap retraction + shoulder extension to neutral 10 x 5"  MANUAL THERAPY: To promote normalized muscle tension, improved flexibility, reduced pain, and reduce L UE radiculopathy .  Cervical spine mobility assessment (see Joint Mobility testing above) STM/DTM and MTP to L UT, LS and scalenes Manual cervical traction 4 x 30 sec - pt reporting reduction in N/T in l forearm Manual L UT and LS stretches x 30" each   08/13/22 Initial Eval   THERAPEUTIC EXERCISE: to improve flexibility, strength and mobility.  Verbal and tactile cues throughout for technique. Cervical Extension AROM with towel 1x10  Supine Chin Tuck  1x10 hold 5"   PATIENT EDUCATION: Education details: POC, Initial HEP  Person educated: Patient Education method: Explanation, Demonstration, Tactile cues, Verbal cues, and Handouts Education comprehension: verbalized understanding and returned demonstration  HOME EXERCISE PROGRAM: Access Code: [email or web address] URL: [email or web address] Date: 08/15/2022 Prepared by: Joseph Mayer  Exercises - Supine Chin Tuck  - 1 x daily - 7 x weekly - 2 sets -  10 reps - 5 sec hold - Cervical Extension AROM with Strap  - 2 x daily - 7 x weekly - 2 sets - 10 reps - 3-5 sec hold - Mid-Lower Cervical Extension SNAG with Strap  - 2 x daily - 7 x weekly - 2 sets - 10 reps - 3-5 sec hold - Seated Upper Trapezius Stretch  - 2 x daily - 7 x weekly - 3 reps - 30 sec hold - Gentle Levator Scapulae Stretch  - 2 x daily - 7 x weekly - 3 reps - 30 sec hold - Scapular  Retraction with Resistance Advanced  - 2 x daily - 7 x weekly - 2 sets - 10 reps - 5 sec hold   ASSESSMENT:  CLINICAL IMPRESSION: Joseph "Rich" reported some initial discomfort in L shoulder with HEP performance but noted relief of pain following HEP performance. HEP reviewed with pt cautioned to avoid pushing into significant increased pain and reminded pt that any discomfort should resolve w/in short time of exercise completion - if this is not the case, pt instructed to hold off on the triggering exercise and inform PT on next visit. Introduced stretching for UT and LS with some initial increased discomfort which resolved following performance of scap retraction - pt may also benefit from instruction in scalene stretch on a future visit. Initiated postural strengthening with pt able to provide better return demonstration of straight arm retraction vs row 2 to tendency to want to shrug shoulders during row. HEP updated to add UT/LS stretches as well as GTB resisted scap retraction to promote continued reduction in abnormal muscle tension as well as better scapular muscle engagement.  OBJECTIVE IMPAIRMENTS: decreased knowledge of condition, decreased ROM, hypomobility, increased muscle spasms, impaired flexibility, impaired UE functional use, improper body mechanics, postural dysfunction, and pain.   ACTIVITY LIMITATIONS: lifting, sitting, sleeping, and locomotion level  PARTICIPATION LIMITATIONS: interpersonal relationship, driving, and community activity  PERSONAL FACTORS: Age, Time since onset of injury/illness/exacerbation, and 1 comorbidity: HTN  are also affecting patient's functional outcome.   REHAB POTENTIAL: Excellent  CLINICAL DECISION MAKING: Stable/uncomplicated  EVALUATION COMPLEXITY: Low  GOALS: Goals reviewed with patient? Yes  SHORT TERM GOALS: Target date: 08/27/2022  Pt will be independent with initial HEP.  Baseline: Goal status: IN PROGRESS  2.  Pt will report a  decrease in pain while sitting and driving by 35-00% to improve ADLs and community involvement. Baseline: see NPS above Goal status: IN PROGRESS  3.  Pt will report a decrease in the N/T along the left arm to decrease pain and discomfort.  Baseline:  Goal status: IN PROGRESS  LONG TERM GOALS: Target date: 09/10/2022  Pt will be independent with HEP to increase independence and decrease pain.  Baseline:  Goal status: IN PROGRESS  2.  Pt will report a score of </= 1/50 on the NDI to improve community involvement and decrease pain with ADLs.  Baseline: 8/50 Goal status: IN PROGRESS  3.  Pt will report a decrease in pain by 50-75% to improve QOL and increase involvement with his community.  Baseline: see NPS above  Goal status: IN PROGRESS  4.  Pt will show an increase of cervical ROM to grossly WNL with extension, right rotation, and right lateral flexion.  Baseline: see ROM chart above Goal status: IN PROGRESS  5.  Pt will be able to sit in a chair/drive without increased pain.  Baseline:  Goal status: IN PROGRESS  6.  Pt will resume normal  gym workout without limitations due to pain. Baseline:  Goal status: IN PROGRESS   PLAN: PT FREQUENCY: 2x/week  PT DURATION: 4 weeks  PLANNED INTERVENTIONS: Therapeutic exercises, Therapeutic activity, Neuromuscular re-education, Patient/Family education, Self Care, Joint mobilization, DME instructions, Dry Needling, Electrical stimulation, Spinal mobilization, Taping, Traction, Manual therapy, and Re-evaluation  PLAN FOR NEXT SESSION: DNF endurance testing; postural education; cervical ROM/ stretching as tolerated; postural strengthening; review & progress/update HEP as indicated. MT +/- DN to address abnormal muscle tension in L cervical paraspinals, scalenes, UT and LS.   Percival Spanish, PT 08/15/2022, 9:56 AM

## 2022-08-20 ENCOUNTER — Ambulatory Visit: Payer: Medicare Other | Admitting: Physical Therapy

## 2022-08-20 ENCOUNTER — Other Ambulatory Visit: Payer: Self-pay | Admitting: Family Medicine

## 2022-08-20 ENCOUNTER — Encounter: Payer: Self-pay | Admitting: Physical Therapy

## 2022-08-20 DIAGNOSIS — M5412 Radiculopathy, cervical region: Secondary | ICD-10-CM | POA: Diagnosis not present

## 2022-08-20 DIAGNOSIS — M542 Cervicalgia: Secondary | ICD-10-CM | POA: Diagnosis not present

## 2022-08-20 DIAGNOSIS — M25512 Pain in left shoulder: Secondary | ICD-10-CM | POA: Diagnosis not present

## 2022-08-20 DIAGNOSIS — S46812A Strain of other muscles, fascia and tendons at shoulder and upper arm level, left arm, initial encounter: Secondary | ICD-10-CM | POA: Diagnosis not present

## 2022-08-20 DIAGNOSIS — G8929 Other chronic pain: Secondary | ICD-10-CM | POA: Diagnosis not present

## 2022-08-20 DIAGNOSIS — R293 Abnormal posture: Secondary | ICD-10-CM | POA: Diagnosis not present

## 2022-08-20 NOTE — Therapy (Addendum)
OUTPATIENT PHYSICAL THERAPY TREATMENT   Patient Name: Joseph Mayer MRN: [id number] DOB:October 05, 1950, 72 y.o., male Today's Date: 08/20/2022  END OF SESSION:  PT End of Session - 08/20/22 0803     Visit Number 3    Date for PT Re-Evaluation 09/10/22    Authorization Type Medicare & [employer]    PT Start Time 0803    PT Stop Time [phone]    PT Time Calculation (min) 40 min    Activity Tolerance Patient tolerated treatment well    Behavior During Therapy [facility] for tasks assessed/performed              Past Medical History:  Diagnosis Date   Diverticulitis    Hypertension    Migraine    Past Surgical History:  Procedure Laterality Date   APPENDECTOMY  1994   BACK SURGERY  2005   Patient Active Problem List   Diagnosis Date Noted   Greater trochanteric pain syndrome of right lower extremity 02/13/2020   Primary osteoarthritis of right knee 01/11/2020   Essential hypertension 09/10/2018    PCP:  [name] DO  REFERRING PROVIDER: [name] DO  REFERRING DIAG: K16.010X (ICD-10-CM) - Strain of left trapezius muscle, initial encounter   THERAPY DIAG:  Cervicalgia  Radiculopathy, cervical region  Chronic left shoulder pain  Abnormal posture  RATIONALE FOR EVALUATION AND TREATMENT: Rehabilitation  ONSET DATE: a year ago in traps lifting weight with bar   NEXT MD VISIT: none at this time    SUBJECTIVE:                                                                                                                                                                                      SUBJECTIVE STATEMENT: Pt reports more numbness in lower forearm than pain today. He reports the scap retraction with the GTB has been helpful in reducing the pain.  PERTINENT HISTORY: HTN, Back sx 2005, Knee OA   PAIN:  Are you having pain? No  PRECAUTIONS: None  WEIGHT BEARING RESTRICTIONS: No  FALLS:  Has patient fallen in last 6 months?  No  LIVING ENVIRONMENT: Lives with: lives with their family and lives with their spouse Lives in: House/apartment Stairs: Yes: External: 1 steps; can reach both Has following equipment at home: None  OCCUPATION: Retired   PLOF: Independent  PATIENT GOALS: get rid of pain, back to normal weight lifting routine    OBJECTIVE: (objective measures completed at initial evaluation unless otherwise dated)   DIAGNOSTIC FINDINGS:  N/A   PATIENT SURVEYS :  NDI 8/50  COGNITION: Overall cognitive status: Within functional limits for tasks assessed  SENSATION: N/T on middle of L arm & hand   POSTURE: Rounded shoulders, slumped sitting posture  CERVICAL ROM:   Active ROM AROM (deg) eval  Flexion WNL  Extension 35  Right lateral flexion 25  Left lateral flexion WNL:45  Right rotation 48  Left rotation WNL   (Blank rows = not tested)  CERVICAL MMT: GROSSLY 5/5 FOR ALL   UPPER EXTREMITY ROM: GROSSLY WNL   UPPER EXTREMITY MMT: GROSSLY 5/5 FOR ALL  CERVICAL SPECIAL TESTS:  Upper limb tension test (ULTT): Negative, Spurling's test: Negative, and Distraction test: Positive  SHOULDER SPECIAL TESTS: SLAP lesions: Biceps load test: negative  JOINT MOBILITY TESTING:  08/15/22 - C7/C6 slight tenderness around the area but seems to be more of the musculature tenderness rather than joint. Cervical spine generally slightly stiff but pt denies any pain or discomfort.   PALPATION:  Slight tenderness on L trap     TODAY'S TREATMENT:                                                                                                                                         DATE:   08/20/22 THERAPEUTIC EXERCISE: to improve flexibility, strength and mobility.  Verbal and tactile cues throughout for technique.  UBE - L2.5 x 6 min (3' each fwd and back) L scalene stretch with towel 3 x 30" - targeting ant/mid/post scalenes Standing GTB scap retraction/depression + shoulder extension to  neutral 10 x 5"  MANUAL THERAPY: To promote normalized muscle tension, improved flexibility, and reduced pain. Skilled palpation and monitoring of soft tissue during DN Trigger Point Dry-Needling  Treatment instructions: Expect mild to moderate muscle soreness. S/S of pneumothorax if dry needled over a lung field, and to seek immediate medical attention should they occur. Patient verbalized understanding of these instructions and education. Patient Consent Given: Yes Education handout provided: Yes Muscles treated: L UT, LS and scalenes, B cervical paraspinals Electrical stimulation performed: No Parameters: N/A Treatment response/outcome: Twitch Response Elicited and Palpable Increase in Muscle Length STM/DTM, manual TPR and pin & stretch to muscles addressed with DN Manual L UT, LS and scalene stretches/PROM   08/15/22 THERAPEUTIC EXERCISE: to improve flexibility, strength and mobility.  Verbal and tactile cues throughout for technique.  UBE - L2.0 x 6 min (3' each fwd and back) Supine cervical retraction 10 x 5" Seated cervical extension AROM with pillowcase 10 x 3" Seated lower-mid cervical extension SNAG with pillowcase 10 x 3" Seated L UT stretch x 30" Seated L LS stretch x 30" Scapular retraction 10 x 5" Seated GTB row + scap retraction 10 x 5" - pt having difficulty not shrugging his shoulders Standing GTB scap retraction + shoulder extension to neutral 10 x 5"  MANUAL THERAPY: To promote normalized muscle tension, improved flexibility, reduced pain, and reduce L UE radiculopathy .  Cervical spine mobility assessment (see Joint Mobility testing above) STM/DTM  and MTP to L UT, LS and scalenes Manual cervical traction 4 x 30 sec - pt reporting reduction in N/T in l forearm Manual L UT and LS stretches x 30" each   08/13/22 Initial Eval   THERAPEUTIC EXERCISE: to improve flexibility, strength and mobility.  Verbal and tactile cues throughout for technique. Cervical Extension  AROM with towel 1x10  Supine Chin Tuck  1x10 hold 5"   PATIENT EDUCATION: Education details: HEP update - scalene stretch, role of DN, and DN rational, procedure, outcomes, potential side effects, and recommended post-treatment exercises/activity  Person educated: Patient Education method: Explanation, Demonstration, and Handouts Education comprehension: verbalized understanding and returned demonstration  HOME EXERCISE PROGRAM: Access Code: [email or web address] URL: [email or web address] Date: 08/20/2022 Prepared by: [name]  Exercises - Supine Chin Tuck  - 1 x daily - 7 x weekly - 2 sets - 10 reps - 5 sec hold - Cervical Extension AROM with Strap  - 2 x daily - 7 x weekly - 2 sets - 10 reps - 3-5 sec hold - Mid-Lower Cervical Extension SNAG with Strap  - 2 x daily - 7 x weekly - 2 sets - 10 reps - 3-5 sec hold - Seated Upper Trapezius Stretch (Mirrored)  - 2 x daily - 7 x weekly - 3 reps - 30 sec hold - Gentle Levator Scapulae Stretch  - 2 x daily - 7 x weekly - 3 reps - 30 sec hold - Scapular Retraction with Resistance Advanced  - 2 x daily - 7 x weekly - 2 sets - 10 reps - 5 sec hold - Seated Scalene Stretch with Towel (Mirrored)  - 2 x daily - 7 x weekly - 3 reps - 30 sec hold  Patient Education - Trigger Point Dry Needling   ASSESSMENT:  CLINICAL IMPRESSION: Joseph "Rich" denies any issues with HEP update from last visit, noting that the GTB scap retraction really seems to help reduce/resolve his pain. Increased muscle tension and TTP remain present in L>R upper shoulder and neck which appeared amenable to DN. After explanation of DN rational, procedures, outcomes and potential side effects, patient verbalized consent to DN treatment in conjunction with manual STM/DTM and TPR to reduce ttp/muscle tension. Muscles treated as indicated above. DN produced normal response with good twitches elicited resulting in palpable reduction in pain/ttp and muscle tension. Pt educated  to expect mild to moderate muscle soreness for up to 24-48 hrs and instructed to continue prescribed home exercise program and current activity level with pt verbalizing understanding of theses instructions.   OBJECTIVE IMPAIRMENTS: decreased knowledge of condition, decreased ROM, hypomobility, increased muscle spasms, impaired flexibility, impaired UE functional use, improper body mechanics, postural dysfunction, and pain.   ACTIVITY LIMITATIONS: lifting, sitting, sleeping, and locomotion level  PARTICIPATION LIMITATIONS: interpersonal relationship, driving, and community activity  PERSONAL FACTORS: Age, Time since onset of injury/illness/exacerbation, and 1 comorbidity: HTN  are also affecting patient's functional outcome.   REHAB POTENTIAL: Excellent  CLINICAL DECISION MAKING: Stable/uncomplicated  EVALUATION COMPLEXITY: Low  GOALS: Goals reviewed with patient? Yes  SHORT TERM GOALS: Target date: 08/27/2022  Pt will be independent with initial HEP.  Baseline: Goal status: IN PROGRESS  2.  Pt will report a decrease in pain while sitting and driving by 09-81% to improve ADLs and community involvement. Baseline: see NPS above Goal status: IN PROGRESS  3.  Pt will report a decrease in the N/T along the left arm to decrease pain and discomfort.  Baseline:  Goal status: IN  PROGRESS  LONG TERM GOALS: Target date: 09/10/2022  Pt will be independent with HEP to increase independence and decrease pain.  Baseline:  Goal status: IN PROGRESS  2.  Pt will report a score of </= 1/50 on the NDI to improve community involvement and decrease pain with ADLs.  Baseline: 8/50 Goal status: IN PROGRESS  3.  Pt will report a decrease in pain by 50-75% to improve QOL and increase involvement with his community.  Baseline: see NPS above  Goal status: IN PROGRESS  4.  Pt will show an increase of cervical ROM to grossly WNL with extension, right rotation, and right lateral flexion.  Baseline: see  ROM chart above Goal status: IN PROGRESS  5.  Pt will be able to sit in a chair/drive without increased pain.  Baseline:  Goal status: IN PROGRESS  6.  Pt will resume normal gym workout without limitations due to pain. Baseline:  Goal status: IN PROGRESS   PLAN: PT FREQUENCY: 2x/week  PT DURATION: 4 weeks  PLANNED INTERVENTIONS: Therapeutic exercises, Therapeutic activity, Neuromuscular re-education, Patient/Family education, Self Care, Joint mobilization, DME instructions, Dry Needling, Electrical stimulation, Spinal mobilization, Taping, Traction, Manual therapy, and Re-evaluation  PLAN FOR NEXT SESSION: assess response to DN; DNF endurance testing; postural education; cervical ROM/ stretching as tolerated; postural strengthening; review & progress/update HEP as indicated. MT +/- DN to address abnormal muscle tension in L cervical paraspinals, scalenes, UT and LS as benefit noted.   Percival Spanish, PT 08/20/2022, 8:58 AM

## 2022-08-22 ENCOUNTER — Ambulatory Visit: Payer: Medicare Other | Attending: Family Medicine | Admitting: Physical Therapy

## 2022-08-22 ENCOUNTER — Encounter: Payer: Self-pay | Admitting: Physical Therapy

## 2022-08-22 DIAGNOSIS — G8929 Other chronic pain: Secondary | ICD-10-CM | POA: Diagnosis not present

## 2022-08-22 DIAGNOSIS — M542 Cervicalgia: Secondary | ICD-10-CM | POA: Diagnosis not present

## 2022-08-22 DIAGNOSIS — M5412 Radiculopathy, cervical region: Secondary | ICD-10-CM | POA: Diagnosis not present

## 2022-08-22 DIAGNOSIS — M25512 Pain in left shoulder: Secondary | ICD-10-CM | POA: Diagnosis not present

## 2022-08-22 DIAGNOSIS — R293 Abnormal posture: Secondary | ICD-10-CM | POA: Insufficient documentation

## 2022-08-22 NOTE — Therapy (Addendum)
OUTPATIENT PHYSICAL THERAPY TREATMENT   Patient Name: Joseph Mayer MRN: [id number] DOB:[birth year], 72 y.o., male Today's Date: 08/22/2022  END OF SESSION:  PT End of Session - 08/22/22 0841     Visit Number 4    Date for PT Re-Evaluation 09/10/22    Authorization Type Medicare & [employer]    PT Start Time [phone]    PT Stop Time 0928    PT Time Calculation (min) 41 min    Activity Tolerance Patient tolerated treatment well    Behavior During Therapy [facility] for tasks assessed/performed              Past Medical History:  Diagnosis Date   Diverticulitis    Hypertension    Migraine    Past Surgical History:  Procedure Laterality Date   APPENDECTOMY  1994   BACK SURGERY  2005   Patient Active Problem List   Diagnosis Date Noted   Greater trochanteric pain syndrome of right lower extremity 02/13/2020   Primary osteoarthritis of right knee 01/11/2020   Essential hypertension 09/10/2018    PCP:  [name] DO  REFERRING PROVIDER: [name] DO  REFERRING DIAG: [id number] (ICD-10-CM) - Strain of left trapezius muscle, initial encounter   THERAPY DIAG:  Cervicalgia  Radiculopathy, cervical region  Chronic left shoulder pain  Abnormal posture  RATIONALE FOR EVALUATION AND TREATMENT: Rehabilitation  ONSET DATE: a year ago in traps lifting weight with bar   NEXT MD VISIT: none at this time    SUBJECTIVE:                                                                                                                                                                                      SUBJECTIVE STATEMENT: Pt reports the DN seemed to really help - he has not had any shoulder pain but still having the numbness in the forearm.  PERTINENT HISTORY: HTN, Back sx 2005, Knee OA   PAIN:  Are you having pain? No  PRECAUTIONS: None  WEIGHT BEARING RESTRICTIONS: No  FALLS:  Has patient fallen in last 6 months? No  LIVING  ENVIRONMENT: Lives with: lives with their family and lives with their spouse Lives in: House/apartment Stairs: Yes: External: 1 steps; can reach both Has following equipment at home: None  OCCUPATION: Retired   PLOF: Independent  PATIENT GOALS: get rid of pain, back to normal weight lifting routine    OBJECTIVE: (objective measures completed at initial evaluation unless otherwise dated)   DIAGNOSTIC FINDINGS:  N/A   PATIENT SURVEYS :  NDI 8/50  COGNITION: Overall cognitive status: Within functional limits for tasks assessed  SENSATION: N/T on middle of L arm & hand   POSTURE: Rounded shoulders, slumped sitting posture  CERVICAL ROM:   Active ROM AROM (deg) eval  Flexion WNL  Extension 35  Right lateral flexion 25  Left lateral flexion WNL:45  Right rotation 48  Left rotation WNL   (Blank rows = not tested)  CERVICAL MMT: GROSSLY 5/5 FOR ALL   UPPER EXTREMITY ROM: GROSSLY WNL   UPPER EXTREMITY MMT: GROSSLY 5/5 FOR ALL  CERVICAL SPECIAL TESTS:  Upper limb tension test (ULTT): Negative, Spurling's test: Negative, and Distraction test: Positive  SHOULDER SPECIAL TESTS: SLAP lesions: Biceps load test: negative  JOINT MOBILITY TESTING:  08/15/22 - C7/C6 slight tenderness around the area but seems to be more of the musculature tenderness rather than joint. Cervical spine generally slightly stiff but pt denies any pain or discomfort.   PALPATION:  Slight tenderness on L trap     TODAY'S TREATMENT:                                                                                                                                         DATE:   08/22/22 THERAPEUTIC EXERCISE: to improve flexibility, strength and mobility.  Verbal and tactile cues throughout for technique.  UBE - L2.5 x 6 min (3' each fwd and back) Attempted radial nerve glides but no change in numbness noted L brachial plexus nerve glide in standing x 10 - pt reports resolution of L forearm  numbness Hooklying with pool noodle along spine: Scap retraction/depression into noodle 10 x 5" GTB scap retraction + B shoulder horiz ABD 10 x 3" GTB scap retraction + B shoulder ER 10 x 3" GTB scap retraction + B shoulder horiz ABD diagonals 10 x 3" R sidelying L open book stretch 10 x 5"  MANUAL THERAPY: To promote normalized muscle tension, improved flexibility, improved joint mobility, increased ROM, reduced pain, and reduce L forearm numbness .  STM and manual TPR to L cervical paraspinals and scalenes, L teres group and lats L scapular mobs - all directions - emphasis on scap retraction and depression Grade II-III cervical spine CPAs and UPAs Manual cervical traction 4 x 30 sec   08/20/22 THERAPEUTIC EXERCISE: to improve flexibility, strength and mobility.  Verbal and tactile cues throughout for technique.  UBE - L2.5 x 6 min (3' each fwd and back) L scalene stretch with towel 3 x 30" - targeting ant/mid/post scalenes Standing GTB scap retraction/depression + shoulder extension to neutral 10 x 5"  MANUAL THERAPY: To promote normalized muscle tension, improved flexibility, and reduced pain. Skilled palpation and monitoring of soft tissue during DN Trigger Point Dry-Needling  Treatment instructions: Expect mild to moderate muscle soreness. S/S of pneumothorax if dry needled over a lung field, and to seek immediate medical attention should they occur. Patient verbalized understanding of these instructions and education. Patient Consent Given: Yes Education handout  provided: Yes Muscles treated: L UT, LS and scalenes, B cervical paraspinals Electrical stimulation performed: No Parameters: N/A Treatment response/outcome: Twitch Response Elicited and Palpable Increase in Muscle Length STM/DTM, manual TPR and pin & stretch to muscles addressed with DN Manual L UT, LS and scalene stretches/PROM   08/15/22 THERAPEUTIC EXERCISE: to improve flexibility, strength and mobility.  Verbal  and tactile cues throughout for technique.  UBE - L2.0 x 6 min (3' each fwd and back) Supine cervical retraction 10 x 5" Seated cervical extension AROM with pillowcase 10 x 3" Seated lower-mid cervical extension SNAG with pillowcase 10 x 3" Seated L UT stretch x 30" Seated L LS stretch x 30" Scapular retraction 10 x 5" Seated GTB row + scap retraction 10 x 5" - pt having difficulty not shrugging his shoulders Standing GTB scap retraction + shoulder extension to neutral 10 x 5"  MANUAL THERAPY: To promote normalized muscle tension, improved flexibility, reduced pain, and reduce L UE radiculopathy .  Cervical spine mobility assessment (see Joint Mobility testing above) STM/DTM and MTP to L UT, LS and scalenes Manual cervical traction 4 x 30 sec - pt reporting reduction in N/T in l forearm Manual L UT and LS stretches x 30" each   PATIENT EDUCATION: Education details: HEP update - brachial plexus nerve glide, open book stretch, scapular strengthening   Person educated: Patient Education method: Explanation, Demonstration, and Handouts Education comprehension: verbalized understanding and returned demonstration  HOME EXERCISE PROGRAM: Access Code: [email or web address] URL: [email or web address] Date: 08/22/2022 Prepared by: [name]  Exercises - Supine Chin Tuck  - 1 x daily - 7 x weekly - 2 sets - 10 reps - 5 sec hold - Cervical Extension AROM with Strap  - 2 x daily - 7 x weekly - 2 sets - 10 reps - 3-5 sec hold - Mid-Lower Cervical Extension SNAG with Strap  - 2 x daily - 7 x weekly - 2 sets - 10 reps - 3-5 sec hold - Seated Upper Trapezius Stretch (Mirrored)  - 2 x daily - 7 x weekly - 3 reps - 30 sec hold - Gentle Levator Scapulae Stretch  - 2 x daily - 7 x weekly - 3 reps - 30 sec hold - Scapular Retraction with Resistance Advanced  - 2 x daily - 7 x weekly - 2 sets - 10 reps - 5 sec hold - Seated Scalene Stretch with Towel (Mirrored)  - 2 x daily - 7 x weekly - 3 reps -  30 sec hold - Supine Shoulder Horizontal Abduction with Resistance  - 1 x daily - 7 x weekly - 2 sets - 10 reps - 3 sec hold - Supine Shoulder External Rotation on Foam Roll with Theraband  - 1 x daily - 7 x weekly - 2 sets - 10 reps - 3 sec hold - Standing Shoulder Diagonal Horizontal Abduction 60/120 Degrees with Resistance  - 1 x daily - 7 x weekly - 2 sets - 10 reps - 3 sec hold - Sidelying Thoracic Rotation with Open Book (Mirrored)  - 1 x daily - 7 x weekly - 10 reps - 5 sec hold  Patient Education - Trigger Point Dry Needling - Brachial plexus nerve glide   ASSESSMENT:  CLINICAL IMPRESSION: Tatem "Rich" reports benefit from the DN with no shoulder pain but ongoing numbness in L forearm. He notes the numbness was initially in the ulnar nerve distribution but more recently has been in the radial nerve distrubution. Attempted radial nerve  glides w/o change in symptoms but able to reduce/eliminate numbness with brachial plexus nerve glide, therefore instructions provided for home performance. Progressed scapular/postural strengthening with pt initially noting increase in L shoulder discomfort and L forearm numbness when first positioned in hooklying over pool noodle but this resolved with performance of scapular strengthening exercises. Good relief also reported with open book stretch. HEP updated to include stretching and strengthening progression. Denice Paradise is demonstrating good initial progress with PT with reduction in both L shoulder pain and L forearm numbness reported - STGs #1 & 3 now met.  OBJECTIVE IMPAIRMENTS: decreased knowledge of condition, decreased ROM, hypomobility, increased muscle spasms, impaired flexibility, impaired UE functional use, improper body mechanics, postural dysfunction, and pain.   ACTIVITY LIMITATIONS: lifting, sitting, sleeping, and locomotion level  PARTICIPATION LIMITATIONS: interpersonal relationship, driving, and community activity  PERSONAL FACTORS: Age,  Time since onset of injury/illness/exacerbation, and 1 comorbidity: HTN  are also affecting patient's functional outcome.   REHAB POTENTIAL: Excellent  CLINICAL DECISION MAKING: Stable/uncomplicated  EVALUATION COMPLEXITY: Low  GOALS: Goals reviewed with patient? Yes  SHORT TERM GOALS: Target date: 08/27/2022  Pt will be independent with initial HEP.  Baseline: Goal status: MET  08/22/22    2.  Pt will report a decrease in pain while sitting and driving by 17-79% to improve ADLs and community involvement. Baseline: see NPS above Goal status: IN PROGRESS  3.  Pt will report a decrease in the N/T along the left arm to decrease pain and discomfort.  Baseline:  Goal status: MET 08/22/22  LONG TERM GOALS: Target date: 09/10/2022  Pt will be independent with HEP to increase independence and decrease pain.  Baseline:  Goal status: IN PROGRESS  2.  Pt will report a score of </= 1/50 on the NDI to improve community involvement and decrease pain with ADLs.  Baseline: 8/50 Goal status: IN PROGRESS  3.  Pt will report a decrease in pain by 50-75% to improve QOL and increase involvement with his community.  Baseline: see NPS above  Goal status: IN PROGRESS  4.  Pt will show an increase of cervical ROM to grossly WNL with extension, right rotation, and right lateral flexion.  Baseline: see ROM chart above Goal status: IN PROGRESS  5.  Pt will be able to sit in a chair/drive without increased pain.  Baseline:  Goal status: IN PROGRESS  6.  Pt will resume normal gym workout without limitations due to pain. Baseline:  Goal status: IN PROGRESS   PLAN: PT FREQUENCY: 2x/week  PT DURATION: 4 weeks  PLANNED INTERVENTIONS: Therapeutic exercises, Therapeutic activity, Neuromuscular re-education, Patient/Family education, Self Care, Joint mobilization, DME instructions, Dry Needling, Electrical stimulation, Spinal mobilization, Taping, Traction, Manual therapy, and Re-evaluation  PLAN FOR  NEXT SESSION: postural education; cervical ROM/ stretching as tolerated; postural strengthening; review & progress/update HEP as indicated. MT +/- DN to address abnormal muscle tension in L cervical paraspinals, scalenes, UT and LS as indicated.   Percival Spanish, PT 08/22/2022, 9:46 AM

## 2022-08-26 ENCOUNTER — Encounter: Payer: Self-pay | Admitting: Physical Therapy

## 2022-08-26 ENCOUNTER — Ambulatory Visit: Payer: Medicare Other | Admitting: Physical Therapy

## 2022-08-26 DIAGNOSIS — G8929 Other chronic pain: Secondary | ICD-10-CM | POA: Diagnosis not present

## 2022-08-26 DIAGNOSIS — M5412 Radiculopathy, cervical region: Secondary | ICD-10-CM | POA: Diagnosis not present

## 2022-08-26 DIAGNOSIS — R293 Abnormal posture: Secondary | ICD-10-CM

## 2022-08-26 DIAGNOSIS — M542 Cervicalgia: Secondary | ICD-10-CM | POA: Diagnosis not present

## 2022-08-26 DIAGNOSIS — M25512 Pain in left shoulder: Secondary | ICD-10-CM | POA: Diagnosis not present

## 2022-08-26 NOTE — Therapy (Signed)
OUTPATIENT PHYSICAL THERAPY TREATMENT   Patient Name: Joseph Mayer MRN: [id number] DOB:Nov 26, 1950, 72 y.o., male Today's Date: 08/26/2022  END OF SESSION:  PT End of Session - 08/26/22 0839     Visit Number 5    Date for PT Re-Evaluation 09/10/22    Authorization Type Medicare & [employer]    PT Start Time 0845    PT Stop Time [phone]    PT Time Calculation (min) 46 min    Activity Tolerance Patient tolerated treatment well    Behavior During Therapy [facility] for tasks assessed/performed              Past Medical History:  Diagnosis Date   Diverticulitis    Hypertension    Migraine    Past Surgical History:  Procedure Laterality Date   [place]  2005   Patient Active Problem List   Diagnosis Date Noted   Greater trochanteric pain syndrome of right lower extremity 02/13/2020   Primary osteoarthritis of right knee 01/11/2020   Essential hypertension 09/10/2018    PCP:  [name] DO  REFERRING PROVIDER: [name] DO  REFERRING DIAG: E83.151V (ICD-10-CM) - Strain of left trapezius muscle, initial encounter   THERAPY DIAG:  Cervicalgia  Radiculopathy, cervical region  Chronic left shoulder pain  Abnormal posture  RATIONALE FOR EVALUATION AND TREATMENT: Rehabilitation  ONSET DATE: a year ago in traps lifting weight with bar   NEXT MD VISIT: none at this time    SUBJECTIVE:                                                                                                                                                                                      SUBJECTIVE STATEMENT: Pt reports driving/riding in the car still sets off the pain and numbness - none before getting int he car to come to PT today.  Otherwise mostly just notes the numbness more than the pain.  PERTINENT HISTORY: HTN, Back sx 2005, Knee OA   PAIN:  Are you having pain? Yes: NPRS scale: 3/10 Pain location: L posterior  shoulder Pain description: stiffness, soreness; numbness in L forearm Aggravating factors: driving Relieving factors: scap retraction, open book stretch  PRECAUTIONS: None  WEIGHT BEARING RESTRICTIONS: No  FALLS:  Has patient fallen in last 6 months? No  LIVING ENVIRONMENT: Lives with: lives with their family and lives with their spouse Lives in: House/apartment Stairs: Yes: External: 1 steps; can reach both Has following equipment at home: None  OCCUPATION: Retired   PLOF: Independent  PATIENT GOALS: get rid of pain, back to normal weight lifting routine  OBJECTIVE: (objective measures completed at initial evaluation unless otherwise dated)   DIAGNOSTIC FINDINGS:  N/A   PATIENT SURVEYS :  NDI 8/50  COGNITION: Overall cognitive status: Within functional limits for tasks assessed     SENSATION: N/T on middle of L arm & hand   POSTURE: Rounded shoulders, slumped sitting posture  CERVICAL ROM:   Active ROM AROM (deg) eval  Flexion WNL  Extension 35  Right lateral flexion 25  Left lateral flexion WNL:45  Right rotation 48  Left rotation WNL   (Blank rows = not tested)  CERVICAL MMT: GROSSLY 5/5 FOR ALL   UPPER EXTREMITY ROM: GROSSLY WNL   UPPER EXTREMITY MMT: GROSSLY 5/5 FOR ALL  CERVICAL SPECIAL TESTS:  Upper limb tension test (ULTT): Negative, Spurling's test: Negative, and Distraction test: Positive  SHOULDER SPECIAL TESTS: SLAP lesions: Biceps load test: negative  JOINT MOBILITY TESTING:  08/15/22 - C7/C6 slight tenderness around the area but seems to be more of the musculature tenderness rather than joint. Cervical spine generally slightly stiff but pt denies any pain or discomfort.   PALPATION:  Slight tenderness on L trap     TODAY'S TREATMENT:                                                                                                                                         DATE:   08/26/22 THERAPEUTIC EXERCISE: to improve  flexibility, strength and mobility.  Verbal and tactile cues throughout for technique.  UBE - L3.0 x 6 min (3' each fwd and back) Standing GTB scap retraction + B shoulder horiz ABD 10 x 3", back along doorframe to promote scap retraction Standing GTB scap retraction + B shoulder ER 10 x 3", back along doorframe to promote scap retraction Standing GTB scap retraction + B shoulder horiz ABD diagonals 10 x 3", back along doorframe to promote scap retraction Standing L open book stretch 10 x 5" Standing GTB lat pull-down 10 x 5" - cues for scap retraction and depression Prone from knees over green Pball - I's, T's, Y's and W's + cervical and thoracic extension x 10 each  MANUAL THERAPY: To promote normalized muscle tension, improved flexibility, and reduced pain. Skilled palpation and monitoring of soft tissue during DN Trigger Point Dry-Needling  Treatment instructions: Expect mild to moderate muscle soreness. S/S of pneumothorax if dry needled over a lung field, and to seek immediate medical attention should they occur. Patient verbalized understanding of these instructions and education. Patient Consent Given: Yes Education handout provided: Previously provided Muscles treated: L lats, teres group, infraspinatus Electrical stimulation performed: No Parameters: N/A Treatment response/outcome: Twitch Response Elicited and Palpable Increase in Muscle Length STM/DTM, manual TPR and pin & stretch to muscles addressed with DN   08/22/22 THERAPEUTIC EXERCISE: to improve flexibility, strength and mobility.  Verbal and tactile cues throughout for technique.  UBE - L2.5 x 6 min (  3' each fwd and back) Attempted radial nerve glides but no change in numbness noted L brachial plexus nerve glide in standing x 10 - pt reports resolution of L forearm numbness Hooklying with pool noodle along spine: Scap retraction/depression into noodle 10 x 5" GTB scap retraction + B shoulder horiz ABD 10 x 3" GTB scap  retraction + B shoulder ER 10 x 3" GTB scap retraction + B shoulder horiz ABD diagonals 10 x 3" R sidelying L open book stretch 10 x 5"  MANUAL THERAPY: To promote normalized muscle tension, improved flexibility, improved joint mobility, increased ROM, reduced pain, and reduce L forearm numbness .  STM and manual TPR to L cervical paraspinals and scalenes, L teres group and lats L scapular mobs - all directions - emphasis on scap retraction and depression Grade II-III cervical spine CPAs and UPAs Manual cervical traction 4 x 30 sec   08/20/22 THERAPEUTIC EXERCISE: to improve flexibility, strength and mobility.  Verbal and tactile cues throughout for technique.  UBE - L2.5 x 6 min (3' each fwd and back) L scalene stretch with towel 3 x 30" - targeting ant/mid/post scalenes Standing GTB scap retraction/depression + shoulder extension to neutral 10 x 5"  MANUAL THERAPY: To promote normalized muscle tension, improved flexibility, and reduced pain. Skilled palpation and monitoring of soft tissue during DN Trigger Point Dry-Needling  Treatment instructions: Expect mild to moderate muscle soreness. S/S of pneumothorax if dry needled over a lung field, and to seek immediate medical attention should they occur. Patient verbalized understanding of these instructions and education. Patient Consent Given: Yes Education handout provided: Yes Muscles treated: L UT, LS and scalenes, B cervical paraspinals Electrical stimulation performed: No Parameters: N/A Treatment response/outcome: Twitch Response Elicited and Palpable Increase in Muscle Length STM/DTM, manual TPR and pin & stretch to muscles addressed with DN Manual L UT, LS and scalene stretches/PROM   PATIENT EDUCATION: Education details: HEP update - prone Pball exercises at pt request   Person educated: Patient Education method: Explanation, Demonstration, and Handouts Education comprehension: verbalized understanding and returned  demonstration  HOME EXERCISE PROGRAM: Access Code: [email or web address] URL: [email or web address] Date: 08/26/2022 Prepared by: [name]  Exercises - Supine Chin Tuck  - 1 x daily - 7 x weekly - 2 sets - 10 reps - 5 sec hold - Cervical Extension AROM with Strap  - 2 x daily - 7 x weekly - 2 sets - 10 reps - 3-5 sec hold - Mid-Lower Cervical Extension SNAG with Strap  - 2 x daily - 7 x weekly - 2 sets - 10 reps - 3-5 sec hold - Seated Upper Trapezius Stretch (Mirrored)  - 2 x daily - 7 x weekly - 3 reps - 30 sec hold - Gentle Levator Scapulae Stretch  - 2 x daily - 7 x weekly - 3 reps - 30 sec hold - Scapular Retraction with Resistance Advanced  - 2 x daily - 7 x weekly - 2 sets - 10 reps - 5 sec hold - Seated Scalene Stretch with Towel (Mirrored)  - 2 x daily - 7 x weekly - 3 reps - 30 sec hold - Supine Shoulder Horizontal Abduction with Resistance  - 1 x daily - 7 x weekly - 2 sets - 10 reps - 3 sec hold - Supine Shoulder External Rotation on Foam Roll with Theraband  - 1 x daily - 7 x weekly - 2 sets - 10 reps - 3 sec hold - Standing Shoulder Diagonal Horizontal  Abduction 60/120 Degrees with Resistance  - 1 x daily - 7 x weekly - 2 sets - 10 reps - 3 sec hold - Sidelying Thoracic Rotation with Open Book (Mirrored)  - 1 x daily - 7 x weekly - 10 reps - 5 sec hold - Prone Shoulder Extension on Swiss Ball  - 1 x daily - 7 x weekly - 2 sets - 10 reps - 3 sec hold - Prone Middle Trapezius Strengthening on Swiss Ball  - 1 x daily - 7 x weekly - 2 sets - 10 reps - 3 sec hold - Prone Lower Trapezius Strengthening on Swiss Ball  - 1 x daily - 7 x weekly - 2 sets - 10 reps - 3 sec hold - Prone Shoulder W on [employer]  - 1 x daily - 7 x weekly - 2 sets - 10 reps - 3 sec hold  Patient Education - Trigger Point Dry Needling - Brachial plexus nerve glide   ASSESSMENT:  CLINICAL IMPRESSION: Brodin "Rich" reports the pain and numbness are no longer constant but now more intermittent with  overall improvement of 60-70% - STG #2 and LTG #3 now met. Driving remains a consistent trigger but otherwise unable to identify other triggering activities. He continues to note some stiffness and tightness in the L posterior shoulder - addressed with MT incorporating DN with good twitch responses elicited. Progressed open book stretch and GTB resisted scapular strengthening from hooklying over pool noodle to standing using doorframe as tactile cue for scap retraction during exercises.  Introduced postural and scapular strengthening in prone over Pball with patient liking these exercises and requesting instructions for home, therefore HEP updated. [place]yon "Rich" will continue to benefit from skilled PT to address ongoing deficits to improve mobility and activity tolerance with decreased pain interference and reduced numbness in left UE.   OBJECTIVE IMPAIRMENTS: decreased knowledge of condition, decreased ROM, hypomobility, increased muscle spasms, impaired flexibility, impaired UE functional use, improper body mechanics, postural dysfunction, and pain.   ACTIVITY LIMITATIONS: lifting, sitting, sleeping, and locomotion level  PARTICIPATION LIMITATIONS: interpersonal relationship, driving, and community activity  PERSONAL FACTORS: Age, Time since onset of injury/illness/exacerbation, and 1 comorbidity: HTN  are also affecting patient's functional outcome.   REHAB POTENTIAL: Excellent  CLINICAL DECISION MAKING: Stable/uncomplicated  EVALUATION COMPLEXITY: Low  GOALS: Goals reviewed with patient? Yes  SHORT TERM GOALS: Target date: 08/27/2022  Pt will be independent with initial HEP.  Baseline: Goal status: MET  08/22/22    2.  Pt will report a decrease in pain while sitting and driving by 27-78% to improve ADLs and community involvement. Baseline: see NPS above Goal status: MET  08/26/22 - 60-70% improvement  3.  Pt will report a decrease in the N/T along the left arm to decrease pain and  discomfort.  Baseline:  Goal status: MET 08/22/22  LONG TERM GOALS: Target date: 09/10/2022  Pt will be independent with HEP to increase independence and decrease pain.  Baseline:  Goal status: IN PROGRESS  2.  Pt will report a score of </= 1/50 on the NDI to improve community involvement and decrease pain with ADLs.  Baseline: 8/50 Goal status: IN PROGRESS  3.  Pt will report a decrease in pain by 50-75% to improve QOL and increase involvement with his community.  Baseline: see NPS above  Goal status: MET  08/26/22 - 60-70% improvement  4.  Pt will show an increase of cervical ROM to grossly WNL with extension, right rotation,  and right lateral flexion.  Baseline: see ROM chart above Goal status: IN PROGRESS  5.  Pt will be able to sit in a chair/drive without increased pain.  Baseline:  Goal status: IN PROGRESS  6.  Pt will resume normal gym workout without limitations due to pain. Baseline:  Goal status: IN PROGRESS   PLAN: PT FREQUENCY: 2x/week  PT DURATION: 4 weeks  PLANNED INTERVENTIONS: Therapeutic exercises, Therapeutic activity, Neuromuscular re-education, Patient/Family education, Self Care, Joint mobilization, DME instructions, Dry Needling, Electrical stimulation, Spinal mobilization, Taping, Traction, Manual therapy, and Re-evaluation  PLAN FOR NEXT SESSION: postural education; cervical ROM/ stretching as tolerated; postural strengthening; review & progress/update HEP as indicated. MT +/- DN to address abnormal muscle tension in L cervical paraspinals, scalenes, UT and LS as indicated.   Percival Spanish, PT 08/26/2022, 12:33 PM

## 2022-08-29 ENCOUNTER — Ambulatory Visit: Payer: Medicare Other | Admitting: Physical Therapy

## 2022-08-29 ENCOUNTER — Encounter: Payer: Self-pay | Admitting: Physical Therapy

## 2022-08-29 DIAGNOSIS — M542 Cervicalgia: Secondary | ICD-10-CM

## 2022-08-29 DIAGNOSIS — R293 Abnormal posture: Secondary | ICD-10-CM | POA: Diagnosis not present

## 2022-08-29 DIAGNOSIS — M5412 Radiculopathy, cervical region: Secondary | ICD-10-CM | POA: Diagnosis not present

## 2022-08-29 DIAGNOSIS — G8929 Other chronic pain: Secondary | ICD-10-CM | POA: Diagnosis not present

## 2022-08-29 DIAGNOSIS — M25512 Pain in left shoulder: Secondary | ICD-10-CM | POA: Diagnosis not present

## 2022-08-29 NOTE — Therapy (Signed)
OUTPATIENT PHYSICAL THERAPY TREATMENT   Patient Name: Joseph Mayer MRN: [id number] DOB:1951-02-06, 72 y.o., male Today's Date: 08/29/2022  END OF SESSION:  PT End of Session - 08/29/22 0928     Visit Number 6    Date for PT Re-Evaluation 09/10/22    Authorization Type Medicare & [employer]    PT Start Time [phone]    PT Stop Time 1011    PT Time Calculation (min) 43 min    Activity Tolerance Patient tolerated treatment well    Behavior During Therapy [facility] for tasks assessed/performed              Past Medical History:  Diagnosis Date   Diverticulitis    Hypertension    Migraine    Past Surgical History:  Procedure Laterality Date   APPENDECTOMY  1994   BACK SURGERY  2005   Patient Active Problem List   Diagnosis Date Noted   Greater trochanteric pain syndrome of right lower extremity 02/13/2020   Primary osteoarthritis of right knee 01/11/2020   Essential hypertension 09/10/2018    PCP:  [name] DO  REFERRING PROVIDER: [name] DO  REFERRING DIAG: [id number] (ICD-10-CM) - Strain of left trapezius muscle, initial encounter   THERAPY DIAG:  Cervicalgia  Radiculopathy, cervical region  Chronic left shoulder pain  Abnormal posture  RATIONALE FOR EVALUATION AND TREATMENT: Rehabilitation  ONSET DATE: a year ago in traps lifting weight with bar   NEXT MD VISIT: none at this time    SUBJECTIVE:                                                                                                                                                                                      SUBJECTIVE STATEMENT: Pt reports the techniques we discussed while driving do seem to make a difference but he has not attempted more than a short drive since our last visit. He denies any pain or numbness this morning.  PERTINENT HISTORY: HTN, Back sx 2005, Knee OA   PAIN:  Are you having pain? No  PRECAUTIONS: None  WEIGHT BEARING RESTRICTIONS:  No  FALLS:  Has patient fallen in last 6 months? No  LIVING ENVIRONMENT: Lives with: lives with their family and lives with their spouse Lives in: House/apartment Stairs: Yes: External: 1 steps; can reach both Has following equipment at home: None  OCCUPATION: Retired   PLOF: Independent  PATIENT GOALS: get rid of pain, back to normal weight lifting routine    OBJECTIVE: (objective measures completed at initial evaluation unless otherwise dated)   DIAGNOSTIC FINDINGS:  N/A   PATIENT SURVEYS :  NDI 8/50  COGNITION:  Overall cognitive status: Within functional limits for tasks assessed     SENSATION: N/T on middle of L arm & hand   POSTURE: Rounded shoulders, slumped sitting posture  CERVICAL ROM:   Active ROM AROM (deg) eval AROM 08/29/22  Flexion WNL WNL  Extension 35 46  Right lateral flexion 25 36  Left lateral flexion WNL:45 39  Right rotation 48 58  Left rotation WNL 65   (Blank rows = not tested)  CERVICAL MMT: GROSSLY 5/5 FOR ALL   UPPER EXTREMITY ROM: GROSSLY WNL   UPPER EXTREMITY MMT: GROSSLY 5/5 FOR ALL  CERVICAL SPECIAL TESTS:  Upper limb tension test (ULTT): Negative, Spurling's test: Negative, and Distraction test: Positive  SHOULDER SPECIAL TESTS: SLAP lesions: Biceps load test: negative  JOINT MOBILITY TESTING:  08/15/22 - C7/C6 slight tenderness around the area but seems to be more of the musculature tenderness rather than joint. Cervical spine generally slightly stiff but pt denies any pain or discomfort.   PALPATION:  Slight tenderness on L trap     TODAY'S TREATMENT:                                                                                                                                         DATE:   08/29/22 THERAPEUTIC EXERCISE: to improve flexibility, strength and mobility.  Verbal and tactile cues throughout for technique.  UBE - L3.0 x 6 min (3' each fwd and back) Seated R cervical rotation SNAGS 10 x 3" Prone from  knees over green Pball - I's, T's, Y's and W's + cervical and thoracic extension 2# x 10 each Serratus roll-ups with 6" FR on wall 2 x 10, 2nd set with looped RTB isometric at forearms/wrists Serratus 3-way wall clocks with looped RTB at wrist x 10 bil Push-up plus on wall 2 x 10  THERAPEUTIC ACTIVITIES: Cervical ROM reassessment Instruction in self-STM/TPR techniques using a Theracane or tennis ball on the wall   08/26/22 THERAPEUTIC EXERCISE: to improve flexibility, strength and mobility.  Verbal and tactile cues throughout for technique.  UBE - L3.0 x 6 min (3' each fwd and back) Standing GTB scap retraction + B shoulder horiz ABD 10 x 3", back along doorframe to promote scap retraction Standing GTB scap retraction + B shoulder ER 10 x 3", back along doorframe to promote scap retraction Standing GTB scap retraction + B shoulder horiz ABD diagonals 10 x 3", back along doorframe to promote scap retraction Standing L open book stretch 10 x 5" Standing GTB lat pull-down 10 x 5" - cues for scap retraction and depression Prone from knees over green Pball - I's, T's, Y's and W's + cervical and thoracic extension x 10 each  MANUAL THERAPY: To promote normalized muscle tension, improved flexibility, and reduced pain. Skilled palpation and monitoring of soft tissue during DN Trigger Point Dry-Needling  Treatment instructions: Expect mild to moderate muscle soreness.  S/S of pneumothorax if dry needled over a lung field, and to seek immediate medical attention should they occur. Patient verbalized understanding of these instructions and education. Patient Consent Given: Yes Education handout provided: Previously provided Muscles treated: L lats, teres group, infraspinatus Electrical stimulation performed: No Parameters: N/A Treatment response/outcome: Twitch Response Elicited and Palpable Increase in Muscle Length STM/DTM, manual TPR and pin & stretch to muscles addressed with  DN   08/22/22 THERAPEUTIC EXERCISE: to improve flexibility, strength and mobility.  Verbal and tactile cues throughout for technique.  UBE - L2.5 x 6 min (3' each fwd and back) Attempted radial nerve glides but no change in numbness noted L brachial plexus nerve glide in standing x 10 - pt reports resolution of L forearm numbness Hooklying with pool noodle along spine: Scap retraction/depression into noodle 10 x 5" GTB scap retraction + B shoulder horiz ABD 10 x 3" GTB scap retraction + B shoulder ER 10 x 3" GTB scap retraction + B shoulder horiz ABD diagonals 10 x 3" R sidelying L open book stretch 10 x 5"  MANUAL THERAPY: To promote normalized muscle tension, improved flexibility, improved joint mobility, increased ROM, reduced pain, and reduce L forearm numbness .  STM and manual TPR to L cervical paraspinals and scalenes, L teres group and lats L scapular mobs - all directions - emphasis on scap retraction and depression Grade II-III cervical spine CPAs and UPAs Manual cervical traction 4 x 30 sec   PATIENT EDUCATION: Education details: HEP update - cervical rotation SNAGs and self-STM techniques to upper and posterior shoulder using Theracane or tennis ball on wall   Person educated: Patient Education method: Explanation, Demonstration, and Handouts Education comprehension: verbalized understanding and returned demonstration  HOME EXERCISE PROGRAM: Access Code: [email or web address] URL: [email or web address] Date: 08/29/2022 Prepared by: [name]  Exercises - Supine Chin Tuck  - 1 x daily - 7 x weekly - 2 sets - 10 reps - 5 sec hold - Cervical Extension AROM with Strap  - 2 x daily - 7 x weekly - 2 sets - 10 reps - 3-5 sec hold - Mid-Lower Cervical Extension SNAG with Strap  - 2 x daily - 7 x weekly - 2 sets - 10 reps - 3-5 sec hold - Seated Upper Trapezius Stretch (Mirrored)  - 2 x daily - 7 x weekly - 3 reps - 30 sec hold - Gentle Levator Scapulae Stretch  - 2 x  daily - 7 x weekly - 3 reps - 30 sec hold - Scapular Retraction with Resistance Advanced  - 2 x daily - 7 x weekly - 2 sets - 10 reps - 5 sec hold - Seated Scalene Stretch with Towel (Mirrored)  - 2 x daily - 7 x weekly - 3 reps - 30 sec hold - Supine Shoulder Horizontal Abduction with Resistance  - 1 x daily - 7 x weekly - 2 sets - 10 reps - 3 sec hold - Supine Shoulder External Rotation on Foam Roll with Theraband  - 1 x daily - 7 x weekly - 2 sets - 10 reps - 3 sec hold - Standing Shoulder Diagonal Horizontal Abduction 60/120 Degrees with Resistance  - 1 x daily - 7 x weekly - 2 sets - 10 reps - 3 sec hold - Sidelying Thoracic Rotation with Open Book (Mirrored)  - 1 x daily - 7 x weekly - 10 reps - 5 sec hold - Prone Shoulder Extension on Swiss Ball  - 1 x daily -  7 x weekly - 2 sets - 10 reps - 3 sec hold - Prone Middle Trapezius Strengthening on Swiss Ball  - 1 x daily - 7 x weekly - 2 sets - 10 reps - 3 sec hold - Prone Lower Trapezius Strengthening on Swiss Ball  - 1 x daily - 7 x weekly - 2 sets - 10 reps - 3 sec hold - Prone Shoulder W on [employer]  - 1 x daily - 7 x weekly - 2 sets - 10 reps - 3 sec hold - Seated Assisted Cervical Rotation with Towel  - 1 x daily - 7 x weekly - 2 sets - 10 reps - 3 sec hold - Theracane Over Shoulder  - 1-2 x daily - 7 x weekly - 1-2 min hold  Patient Education - Trigger Point Dry Needling - Brachial plexus nerve glide   ASSESSMENT:  CLINICAL IMPRESSION: Joseph "Rich" denies any pain or numbness on arrival to PT today, noting he was even able to sleep on his side w/o issue. He notes the standing modifications of the HEP seem to be working better for him at home and new prone Pball exercises are going well. Reassessment of cervical ROM demonstrating overall improvement but rotation and side-bending to the R remains slightly more limited as compared to the L. Cervical rotation SNAGs introduced to promote increased rotation ROM. Strengthening continuing  to target postural strengthening and stabilization adding light weights to prone strengthening and introducing more serratus activation. Rich remained pain-free with no numbness or tingling reported during session, but states his real test will be a 2-hr drive x 2 and sitting through a gymnastics meet on the weekend. If pain and radicular symptoms remain well controlled, will consider transition to HEP in upcoming visits.  OBJECTIVE IMPAIRMENTS: decreased knowledge of condition, decreased ROM, hypomobility, increased muscle spasms, impaired flexibility, impaired UE functional use, improper body mechanics, postural dysfunction, and pain.   ACTIVITY LIMITATIONS: lifting, sitting, sleeping, and locomotion level  PARTICIPATION LIMITATIONS: interpersonal relationship, driving, and community activity  PERSONAL FACTORS: Age, Time since onset of injury/illness/exacerbation, and 1 comorbidity: HTN  are also affecting patient's functional outcome.   REHAB POTENTIAL: Excellent  CLINICAL DECISION MAKING: Stable/uncomplicated  EVALUATION COMPLEXITY: Low  GOALS: Goals reviewed with patient? Yes  SHORT TERM GOALS: Target date: 08/27/2022  Pt will be independent with initial HEP.  Baseline: Goal status: MET  08/22/22    2.  Pt will report a decrease in pain while sitting and driving by [id number] to improve ADLs and community involvement. Baseline: see NPS above Goal status: MET  08/26/22 - 60-70% improvement  3.  Pt will report a decrease in the N/T along the left arm to decrease pain and discomfort.  Baseline:  Goal status: MET 08/22/22  LONG TERM GOALS: Target date: 09/10/2022  Pt will be independent with HEP to increase independence and decrease pain.  Baseline:  Goal status: IN PROGRESS  08/29/22 - Met for current HEP  2.  Pt will report a score of </= 1/50 on the NDI to improve community involvement and decrease pain with ADLs.  Baseline: 8/50 Goal status: IN PROGRESS  3.  Pt will report a decrease  in pain by 50-75% to improve QOL and increase involvement with his community.  Baseline: see NPS above  Goal status: MET  08/26/22 - 60-70% improvement  4.  Pt will show an increase of cervical ROM to grossly WNL with extension, right rotation, and right lateral flexion.  Baseline: see  ROM chart above Goal status: IN PROGRESS  08/29/22 - ROM improving but R rotation & lateral flexion remain slightly limited as compared to L.  5.  Pt will be able to sit in a chair/drive without increased pain.  Baseline:  Goal status: IN PROGRESS  6.  Pt will resume normal gym workout without limitations due to pain. Baseline:  Goal status: IN PROGRESS   PLAN: PT FREQUENCY: 2x/week  PT DURATION: 4 weeks  PLANNED INTERVENTIONS: Therapeutic exercises, Therapeutic activity, Neuromuscular re-education, Patient/Family education, Self Care, Joint mobilization, DME instructions, Dry Needling, Electrical stimulation, Spinal mobilization, Taping, Traction, Manual therapy, and Re-evaluation  PLAN FOR NEXT SESSION: reinforce postural education; cervical ROM/ stretching as tolerated; postural strengthening; review & progress/update HEP as indicated. MT +/- DN to address abnormal muscle tension in L cervical paraspinals, scalenes, UT and LS as indicated.   Percival Spanish, PT 08/29/2022, 10:21 AM

## 2022-09-02 ENCOUNTER — Ambulatory Visit: Payer: Medicare Other | Admitting: Physical Therapy

## 2022-09-02 ENCOUNTER — Encounter: Payer: Self-pay | Admitting: Physical Therapy

## 2022-09-02 DIAGNOSIS — G8929 Other chronic pain: Secondary | ICD-10-CM

## 2022-09-02 DIAGNOSIS — M542 Cervicalgia: Secondary | ICD-10-CM

## 2022-09-02 DIAGNOSIS — R293 Abnormal posture: Secondary | ICD-10-CM

## 2022-09-02 DIAGNOSIS — M5412 Radiculopathy, cervical region: Secondary | ICD-10-CM | POA: Diagnosis not present

## 2022-09-02 DIAGNOSIS — M25512 Pain in left shoulder: Secondary | ICD-10-CM | POA: Diagnosis not present

## 2022-09-02 NOTE — Therapy (Addendum)
OUTPATIENT PHYSICAL THERAPY TREATMENT   Patient Name: Joseph Mayer MRN: [id number] DOB:[birth year], 72 y.o., male Today's Date: 09/02/2022  END OF SESSION:  PT End of Session - 09/02/22 0840     Visit Number 7    Date for PT Re-Evaluation 09/10/22    Authorization Type Medicare & [employer]    PT Start Time 0840    PT Stop Time 0924    PT Time Calculation (min) 44 min    Activity Tolerance Patient tolerated treatment well    Behavior During Therapy [facility] for tasks assessed/performed              Past Medical History:  Diagnosis Date   Diverticulitis    Hypertension    Migraine    Past Surgical History:  Procedure Laterality Date   APPENDECTOMY  1994   BACK SURGERY  2005   Patient Active Problem List   Diagnosis Date Noted   Greater trochanteric pain syndrome of right lower extremity 02/13/2020   Primary osteoarthritis of right knee 01/11/2020   Essential hypertension 09/10/2018    PCP:  [name] DO  REFERRING PROVIDER: [place], [place]: [id number] (ICD-10-CM) - Strain of left trapezius muscle, initial encounter   THERAPY DIAG:  Cervicalgia  Radiculopathy, cervical region  Chronic left shoulder pain  Abnormal posture  RATIONALE FOR EVALUATION AND TREATMENT: Rehabilitation  ONSET DATE: a year ago in traps lifting weight with bar   NEXT MD VISIT: none at this time    SUBJECTIVE:                                                                                                                                                                                      SUBJECTIVE STATEMENT: Pt reports the 2-hr drive over the weekend "was no problem" but 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 Currently having some mild soreness in the shoulder and mild numbness in his forearm.  PERTINENT HISTORY: HTN, Back sx 2005, Knee OA    PAIN:  Are you having pain? Yes: NPRS scale: 1/10 Pain location: L shoulder & forearm Pain description: shoulder - sore & forearm - numb Aggravating factors: sitting in the bleachers at a gymnastics meet Relieving factors: exercises and stretches  PRECAUTIONS: None  WEIGHT BEARING RESTRICTIONS: No  FALLS:  Has patient fallen in last 6 months? No  LIVING ENVIRONMENT: Lives with: lives with their family and lives with their spouse Lives in: House/apartment Stairs: Yes: External: 1 steps; can reach both Has following equipment at home: None  OCCUPATION: Retired   PLOF: Independent  PATIENT GOALS: get rid of pain, back to normal weight lifting routine    OBJECTIVE: (objective measures completed at initial evaluation unless otherwise dated)   DIAGNOSTIC FINDINGS:  N/A   PATIENT SURVEYS :  NDI 8/50  COGNITION: Overall cognitive status: Within functional limits for tasks assessed     SENSATION: N/T on middle of L arm & hand   POSTURE: Rounded shoulders, slumped sitting posture  CERVICAL ROM:   Active ROM AROM (deg) eval AROM 08/29/22  Flexion WNL WNL  Extension 35 46  Right lateral flexion 25 36  Left lateral flexion WNL:45 39  Right rotation 48 58  Left rotation WNL 65   (Blank rows = not tested)  CERVICAL MMT: GROSSLY 5/5 FOR ALL   UPPER EXTREMITY ROM: GROSSLY WNL   UPPER EXTREMITY MMT: GROSSLY 5/5 FOR ALL  CERVICAL SPECIAL TESTS:  Upper limb tension test (ULTT): Negative, Spurling's test: Negative, and Distraction test: Positive  SHOULDER SPECIAL TESTS: SLAP lesions: Biceps load test: negative  JOINT MOBILITY TESTING:  08/15/22 - C7/C6 slight tenderness around the area but seems to be more of the musculature tenderness rather than joint. Cervical spine generally slightly stiff but pt denies any pain or discomfort.   PALPATION:  Slight tenderness on L trap     TODAY'S TREATMENT:                                                                                                                                          DATE:   09/02/22 THERAPEUTIC EXERCISE: to improve flexibility, strength and mobility.  Verbal and tactile cues throughout for technique.  UBE - L3.0 x 6 min (3' each fwd and back) Seated L/R UT stretch + slight overpressure 2 x 30" Seated L/R LS stretch + slight overpressure 2 x 30" Seated L/R scalene stretch + strap to depress shoulder 2 x 30" Seated L/R cervical rotation SNAGs 10 x 3" Seated cervical extension SNAGs 10 x 3" Standing blue TB scap retraction + B shoulder extension 10 x 3", 2 sets Standing  blue TB scap retraction + B shoulder horiz ABD 10 x 3", back along doorframe to promote scap retraction Standing blue TB scap retraction + B shoulder ER 10 x 3", back along doorframe to promote scap retraction Standing blue TB scap retraction + B shoulder horiz ABD diagonals 10 x 3", back along doorframe to promote scap retraction  MANUAL THERAPY: To promote normalized muscle tension, improved flexibility, reduced pain, and reduce radicular L UE numbness . Skilled palpation and monitoring of soft tissue during DN Trigger Point Dry-Needling  Treatment instructions: Expect mild to moderate muscle soreness. S/S of pneumothorax if dry needled over a lung field, and to seek immediate medical attention should they occur. Patient verbalized understanding of these instructions and education. Patient Consent Given: Yes Education handout provided: Previously provided Muscles treated: L  UT, LS Electrical stimulation performed: No Parameters: N/A Treatment response/outcome: Twitch Response Elicited and Palpable Increase in Muscle Length STM/DTM, manual TPR and pin & stretch to muscles addressed with DN   08/29/22 THERAPEUTIC EXERCISE: to improve flexibility, strength and mobility.  Verbal and tactile cues throughout for technique.  UBE - L3.0 x 6 min (3' each fwd and back) Seated R cervical rotation SNAGS 10 x 3" Prone from  knees over green Pball - I's, T's, Y's and W's + cervical and thoracic extension 2# x 10 each Serratus roll-ups with 6" FR on wall 2 x 10, 2nd set with looped RTB isometric at forearms/wrists Serratus 3-way wall clocks with looped RTB at wrist x 10 bil Push-up plus on wall 2 x 10  THERAPEUTIC ACTIVITIES: Cervical ROM reassessment Instruction in self-STM/TPR techniques using a Theracane or tennis ball on the wall   08/26/22 THERAPEUTIC EXERCISE: to improve flexibility, strength and mobility.  Verbal and tactile cues throughout for technique.  UBE - L3.0 x 6 min (3' each fwd and back) Standing GTB scap retraction + B shoulder horiz ABD 10 x 3", back along doorframe to promote scap retraction Standing GTB scap retraction + B shoulder ER 10 x 3", back along doorframe to promote scap retraction Standing GTB scap retraction + B shoulder horiz ABD diagonals 10 x 3", back along doorframe to promote scap retraction Standing L open book stretch 10 x 5" Standing GTB lat pull-down 10 x 5" - cues for scap retraction and depression Prone from knees over green Pball - I's, T's, Y's and W's + cervical and thoracic extension x 10 each  MANUAL THERAPY: To promote normalized muscle tension, improved flexibility, and reduced pain. Skilled palpation and monitoring of soft tissue during DN Trigger Point Dry-Needling  Treatment instructions: Expect mild to moderate muscle soreness. S/S of pneumothorax if dry needled over a lung field, and to seek immediate medical attention should they occur. Patient verbalized understanding of these instructions and education. Patient Consent Given: Yes Education handout provided: Previously provided Muscles treated: L lats, teres group, infraspinatus Electrical stimulation performed: No Parameters: N/A Treatment response/outcome: Twitch Response Elicited and Palpable Increase in Muscle Length STM/DTM, manual TPR and pin & stretch to muscles addressed with DN   PATIENT  EDUCATION: Education details: HEP progression - TB advanced to blue TB   Person educated: Patient Education method: Explanation Education comprehension: verbalized understanding  HOME EXERCISE PROGRAM: Access Code: [email or web address] URL: [email or web address] Date: 08/29/2022 Prepared by: [name]  Exercises - Supine Chin Tuck  - 1 x daily - 7 x weekly - 2 sets - 10 reps - 5 sec hold - Cervical Extension AROM with Strap  - 2 x daily - 7 x weekly - 2 sets - 10 reps - 3-5 sec hold - Mid-Lower Cervical Extension SNAG with Strap  - 2 x daily - 7 x weekly - 2 sets - 10 reps - 3-5 sec hold - Seated Upper Trapezius Stretch (Mirrored)  - 2 x daily - 7 x weekly - 3 reps - 30 sec hold - Gentle Levator Scapulae Stretch  - 2 x daily - 7 x weekly - 3 reps - 30 sec hold - Scapular Retraction with Resistance Advanced  - 2 x daily - 7 x weekly - 2 sets - 10 reps - 5 sec hold - Seated Scalene Stretch with Towel (Mirrored)  - 2 x daily - 7 x weekly - 3 reps - 30 sec hold - Supine Shoulder Horizontal Abduction with Resistance  -  1 x daily - 7 x weekly - 2 sets - 10 reps - 3 sec hold - Supine Shoulder External Rotation on Foam Roll with Theraband  - 1 x daily - 7 x weekly - 2 sets - 10 reps - 3 sec hold - Standing Shoulder Diagonal Horizontal Abduction 60/120 Degrees with Resistance  - 1 x daily - 7 x weekly - 2 sets - 10 reps - 3 sec hold - Sidelying Thoracic Rotation with Open Book (Mirrored)  - 1 x daily - 7 x weekly - 10 reps - 5 sec hold - Prone Shoulder Extension on Swiss Ball  - 1 x daily - 7 x weekly - 2 sets - 10 reps - 3 sec hold - Prone Middle Trapezius Strengthening on Swiss Ball  - 1 x daily - 7 x weekly - 2 sets - 10 reps - 3 sec hold - Prone Lower Trapezius Strengthening on Swiss Ball  - 1 x daily - 7 x weekly - 2 sets - 10 reps - 3 sec hold - Prone Shoulder W on [employer]  - 1 x daily - 7 x weekly - 2 sets - 10 reps - 3 sec hold - Seated Assisted Cervical Rotation with Towel  - 1 x  daily - 7 x weekly - 2 sets - 10 reps - 3 sec hold - Theracane Over Shoulder  - 1-2 x daily - 7 x weekly - 1-2 min hold  Patient Education - Trigger Point Dry Needling - Brachial plexus nerve glide   ASSESSMENT:  CLINICAL IMPRESSION: Joseph "Rich" reports he was able to complete the 2-hr drive over the weekend w/o issues but states sitting the in bleachers for 2 hrs at the gymnastics meet triggered return of the L shoulder pain and forearm numbness. He reports the intensity was not as bad as in the past but he was unable to get relief from the postural exercises at the time. Other than at the meet, he reports pain and numbness remained mostly well controlled over the weekend. Increased muscle tension still evident in L lateral neck/upper shoulder with L shoulder sitting slightly higher than the R - addressed with MT incorporating DN with good twitch responses elicited resulting in palpable reduction in muscle tension and improved shoulder alignment. Progressed scapular strengthening exercises to blue TB with good tolerance but continued intermittent cues necessary for good posture and alignment/technique during exercises. Bracken "[place]ice [place]" will continue to benefit from skilled PT to address ongoing postural and abnormal muscle tension deficits to improve mobility and activity tolerance with decreased pain interference.   OBJECTIVE IMPAIRMENTS: decreased knowledge of condition, decreased ROM, hypomobility, increased muscle spasms, impaired flexibility, impaired UE functional use, improper body mechanics, postural dysfunction, and pain.   ACTIVITY LIMITATIONS: lifting, sitting, sleeping, and locomotion level  PARTICIPATION LIMITATIONS: interpersonal relationship, driving, and community activity  PERSONAL FACTORS: Age, Time since onset of injury/illness/exacerbation, and 1 comorbidity: HTN  are also affecting patient's functional outcome.   REHAB POTENTIAL: Excellent  CLINICAL DECISION MAKING:  Stable/uncomplicated  EVALUATION COMPLEXITY: Low  GOALS: Goals reviewed with patient? Yes  SHORT TERM GOALS: Target date: 08/27/2022  Pt will be independent with initial HEP.  Baseline: Goal status: MET  08/22/22    2.  Pt will report a decrease in pain while sitting and driving by [id number] to improve ADLs and community involvement. Baseline: see NPS above Goal status: MET  08/26/22 - 60-70% improvement  3.  Pt will report a decrease in the N/T along the  left arm to decrease pain and discomfort.  Baseline:  Goal status: MET 08/22/22  LONG TERM GOALS: Target date: 09/10/2022  Pt will be independent with HEP to increase independence and decrease pain.  Baseline:  Goal status: IN PROGRESS  08/29/22 - Met for current HEP  2.  Pt will report a score of </= 1/50 on the NDI to improve community involvement and decrease pain with ADLs.  Baseline: 8/50 Goal status: IN PROGRESS  3.  Pt will report a decrease in pain by 50-75% to improve QOL and increase involvement with his community.  Baseline: see NPS above  Goal status: MET  08/26/22 - 60-70% improvement  4.  Pt will show an increase of cervical ROM to grossly WNL with extension, right rotation, and right lateral flexion.  Baseline: see ROM chart above Goal status: IN PROGRESS  08/29/22 - ROM improving but R rotation & lateral flexion remain slightly limited as compared to L.  5.  Pt will be able to sit in a chair/drive without increased pain.  Baseline:  Goal status: IN PROGRESS  09/02/22 - able to drive 2 hrs w/o increased pain or numbness/tingling in L forearm/hand but symptoms returned/present while sitting in bleachers at gymnastic meet  6.  Pt will resume normal gym workout without limitations due to pain. Baseline:  Goal status: IN PROGRESS   PLAN: PT FREQUENCY: 2x/week  PT DURATION: 4 weeks  PLANNED INTERVENTIONS: Therapeutic exercises, Therapeutic activity, Neuromuscular re-education, Patient/Family education, Self Care, Joint  mobilization, DME instructions, Dry Needling, Electrical stimulation, Spinal mobilization, Taping, Traction, Manual therapy, and Re-evaluation  PLAN FOR NEXT SESSION: reinforce postural education; cervical ROM/ stretching as tolerated; postural strengthening; review & progress/update HEP as indicated in prep for potential transition to HEP as of 09/10/22; MT +/- DN to address abnormal muscle tension in L cervical paraspinals, scalenes, UT and LS as indicated.   Percival Spanish, PT 09/02/2022, 9:30 AM

## 2022-09-05 ENCOUNTER — Encounter: Payer: Self-pay | Admitting: Physical Therapy

## 2022-09-05 ENCOUNTER — Ambulatory Visit: Payer: Medicare Other | Admitting: Physical Therapy

## 2022-09-05 DIAGNOSIS — R293 Abnormal posture: Secondary | ICD-10-CM

## 2022-09-05 DIAGNOSIS — M542 Cervicalgia: Secondary | ICD-10-CM

## 2022-09-05 DIAGNOSIS — M25512 Pain in left shoulder: Secondary | ICD-10-CM | POA: Diagnosis not present

## 2022-09-05 DIAGNOSIS — G8929 Other chronic pain: Secondary | ICD-10-CM | POA: Diagnosis not present

## 2022-09-05 DIAGNOSIS — M5412 Radiculopathy, cervical region: Secondary | ICD-10-CM | POA: Diagnosis not present

## 2022-09-05 NOTE — Therapy (Signed)
OUTPATIENT PHYSICAL THERAPY TREATMENT   Patient Name: Joseph Mayer MRN: [id number] DOB:[birth year], 72 y.o., male Today's Date: 09/05/2022  END OF SESSION:  PT End of Session - 09/05/22 0847     Visit Number 8    Date for PT Re-Evaluation 09/10/22    Authorization Type Medicare & [employer]    PT Start Time [phone]    PT Stop Time 0930    PT Time Calculation (min) 40 min    Activity Tolerance Patient tolerated treatment well    Behavior During Therapy [facility] for tasks assessed/performed              Past Medical History:  Diagnosis Date   Diverticulitis    Hypertension    Migraine    Past Surgical History:  Procedure Laterality Date   APPENDECTOMY  1994   BACK SURGERY  2005   Patient Active Problem List   Diagnosis Date Noted   Greater trochanteric pain syndrome of right lower extremity 02/13/2020   Primary osteoarthritis of right knee 01/11/2020   Essential hypertension 09/10/2018    PCP:  [name] DO  REFERRING PROVIDER: [name] DO  REFERRING DIAG: [phone] (ICD-10-CM) - Strain of left trapezius muscle, initial encounter   THERAPY DIAG:  Cervicalgia  Radiculopathy, cervical region  Chronic left shoulder pain  Abnormal posture  RATIONALE FOR EVALUATION AND TREATMENT: Rehabilitation  ONSET DATE: a year ago in traps lifting weight with bar   NEXT MD VISIT: none at this time    SUBJECTIVE:                                                                                                                                                                                      SUBJECTIVE STATEMENT: Pt reports the shoulder seems to have settled into a consistent very mild dull ache and the numbness will arbitrarily be present in the forearm and hand, mostly in the hand.  PERTINENT HISTORY: HTN, Back sx 2005, Knee OA   PAIN:  Are you having pain? 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 No  LIVING ENVIRONMENT: Lives with: lives with their family and lives with their spouse Lives in: House/apartment Stairs: Yes: External: 1 steps; can reach both Has following equipment at home: None  OCCUPATION: Retired   PLOF: Independent  PATIENT GOALS: get rid of pain, back to  normal weight lifting routine    OBJECTIVE: (objective measures completed at initial evaluation unless otherwise dated)   DIAGNOSTIC FINDINGS:  N/A   PATIENT SURVEYS :  NDI 8/50  COGNITION: Overall cognitive status: Within functional limits for tasks assessed     SENSATION: N/T on middle of L arm & hand   POSTURE: Rounded shoulders, slumped sitting posture  CERVICAL ROM:   Active ROM AROM (deg) eval AROM 08/29/22  Flexion WNL WNL  Extension 35 46  Right lateral flexion 25 36  Left lateral flexion WNL:45 39  Right rotation 48 58  Left rotation WNL 65   (Blank rows = not tested)  CERVICAL MMT: GROSSLY 5/5 FOR ALL   UPPER EXTREMITY ROM: GROSSLY WNL   UPPER EXTREMITY MMT: GROSSLY 5/5 FOR ALL  CERVICAL SPECIAL TESTS:  Upper limb tension test (ULTT): Negative, Spurling's test: Negative, and Distraction test: Positive  SHOULDER SPECIAL TESTS: SLAP lesions: Biceps load test: negative  JOINT MOBILITY TESTING:  08/15/22 - C7/C6 slight tenderness around the area but seems to be more of the musculature tenderness rather than joint. Cervical spine generally slightly stiff but pt denies any pain or discomfort.   PALPATION:  Slight tenderness on L trap     TODAY'S TREATMENT:                                                                                                                                          DATE:   09/05/22 THERAPEUTIC EXERCISE: to improve flexibility, strength and mobility.  Verbal and tactile cues throughout for technique.  NuStep - L5 x 6 min S/L L shoulder ER + scap retraction 3# 2 x 10 S/L L shoulder hoirz ABD + scap retraction 3#  2 x 10 S/L L shoulder flexion to 90 + scap retraction 3# 2 x 10 Seated RTB cervical retraction 10 x 5", 2 sets Serratus 3-way wall clocks with looped RTB at wrist x 10 bil Serratus roll-ups with 6" FR on wall 2 x 10 with looped RTB isometric at forearms/wrists  MANUAL THERAPY: To promote normalized muscle tension, improved flexibility, reduced pain, and reduction in radicular symptoms .  STM and manual TPR to L teres group, lats, rhomboids, traps and subscapularis L scapular mobs - all directions - emphasis on scap retraction and depression   09/02/22 THERAPEUTIC EXERCISE: to improve flexibility, strength and mobility.  Verbal and tactile cues throughout for technique.  UBE - L3.0 x 6 min (3' each fwd and back) Seated L/R UT stretch + slight overpressure 2 x 30" Seated L/R LS stretch + slight overpressure 2 x 30" Seated L/R scalene stretch + strap to depress shoulder 2 x 30" Seated L/R cervical rotation SNAGs 10 x 3" Seated cervical extension SNAGs 10 x 3" Standing blue TB scap retraction + B shoulder extension 10 x 3", 2 sets Standing  blue TB scap retraction + B shoulder horiz  ABD 10 x 3", back along doorframe to promote scap retraction Standing blue TB scap retraction + B shoulder ER 10 x 3", back along doorframe to promote scap retraction Standing blue TB scap retraction + B shoulder horiz ABD diagonals 10 x 3", back along doorframe to promote scap retraction  MANUAL THERAPY: To promote normalized muscle tension, improved flexibility, reduced pain, and reduce radicular L UE numbness . Skilled palpation and monitoring of soft tissue during DN Trigger Point Dry-Needling  Treatment instructions: Expect mild to  moderate muscle soreness. S/S of pneumothorax if dry needled over a lung field, and to seek immediate medical attention should they occur. Patient verbalized understanding of these instructions and education. Patient Consent Given: Yes Education handout provided: Previously provided Muscles treated: L UT, LS Electrical stimulation performed: No Parameters: N/A Treatment response/outcome: Twitch Response Elicited and Palpable Increase in Muscle Length STM/DTM, manual TPR and pin & stretch to muscles addressed with DN   08/29/22 THERAPEUTIC EXERCISE: to improve flexibility, strength and mobility.  Verbal and tactile cues throughout for technique.  UBE - L3.0 x 6 min (3' each fwd and back) Seated R cervical rotation SNAGS 10 x 3" Prone from knees over green Pball - I's, T's, Y's and W's + cervical and thoracic extension 2# x 10 each Serratus roll-ups with 6" FR on wall 2 x 10, 2nd set with looped RTB isometric at forearms/wrists Serratus 3-way wall clocks with looped RTB at wrist x 10 bil Push-up plus on wall 2 x 10  THERAPEUTIC ACTIVITIES: Cervical ROM reassessment Instruction in self-STM/TPR techniques using a Theracane or tennis ball on the wall   PATIENT EDUCATION: Education details: HEP progression - TB advanced to blue TB  09/02/22 Person educated: Patient Education method: Explanation Education comprehension: verbalized understanding  HOME EXERCISE PROGRAM: Access Code: [email or web address] URL: [email or web address] Date: 08/29/2022 Prepared by: [name]  Exercises - Supine Chin Tuck  - 1 x daily - 7 x weekly - 2 sets - 10 reps - 5 sec hold - Cervical Extension AROM with Strap  - 2 x daily - 7 x weekly - 2 sets - 10 reps - 3-5 sec hold - Mid-Lower Cervical Extension SNAG with Strap  - 2 x daily - 7 x weekly - 2 sets - 10 reps - 3-5 sec hold - Seated Upper Trapezius Stretch (Mirrored)  - 2 x daily - 7 x weekly - 3 reps - 30 sec hold - Gentle Levator Scapulae Stretch  -  2 x daily - 7 x weekly - 3 reps - 30 sec hold - Scapular Retraction with Resistance Advanced  - 2 x daily - 7 x weekly - 2 sets - 10 reps - 5 sec hold - Seated Scalene Stretch with Towel (Mirrored)  - 2 x daily - 7 x weekly - 3 reps - 30 sec hold - Supine Shoulder Horizontal Abduction with Resistance  - 1 x daily - 7 x weekly - 2 sets - 10 reps - 3 sec hold - Supine Shoulder External Rotation on Foam Roll with Theraband  - 1 x daily - 7 x weekly - 2 sets - 10 reps - 3 sec hold - Standing Shoulder Diagonal Horizontal Abduction 60/120 Degrees with Resistance  - 1 x daily - 7 x weekly - 2 sets - 10 reps - 3 sec hold - Sidelying Thoracic Rotation with Open Book (Mirrored)  - 1 x daily - 7 x weekly - 10 reps - 5 sec hold - Prone Shoulder Extension on  Swiss Ball  - 1 x daily - 7 x weekly - 2 sets - 10 reps - 3 sec hold - Prone Middle Trapezius Strengthening on Swiss Ball  - 1 x daily - 7 x weekly - 2 sets - 10 reps - 3 sec hold - Prone Lower Trapezius Strengthening on Swiss Ball  - 1 x daily - 7 x weekly - 2 sets - 10 reps - 3 sec hold - Prone Shoulder W on [employer]  - 1 x daily - 7 x weekly - 2 sets - 10 reps - 3 sec hold - Seated Assisted Cervical Rotation with Towel  - 1 x daily - 7 x weekly - 2 sets - 10 reps - 3 sec hold - Theracane Over Shoulder  - 1-2 x daily - 7 x weekly - 1-2 min hold  Patient Education - Trigger Point Dry Needling - Brachial plexus nerve glide   ASSESSMENT:  CLINICAL IMPRESSION: Autumn "Rich" reports significant reduction in his overall pain and the L UE radicular symptoms, with current pain only very mild dull ache in posterior shoulder and UE radicular symptoms appearing "arbitrarily", however most of the triggers he describes appear to be postural when he is sitting w/o support or reaching forward/down. He is typically able to get relief of symptoms with postural correction and scapular muscle engagement. MT addressing mild increased muscle tension in periscapular  muscles, but no TPs or taut bands identified today. Strengthening reinforcing postural and scapular/posterior shoulder muscle engagement with pt noting improvement in pain and radicular symptoms as the visit progressed. Rich has one visit remaining in his current POC and feels that he should be ready to transition to his HEP at that time, but may benefit from 30-day hold in the event that he does not continue to see improvement or symptoms were to return with increased intensity.  OBJECTIVE IMPAIRMENTS: decreased knowledge of condition, decreased ROM, hypomobility, increased muscle spasms, impaired flexibility, impaired UE functional use, improper body mechanics, postural dysfunction, and pain.   ACTIVITY LIMITATIONS: lifting, sitting, sleeping, and locomotion level  PARTICIPATION LIMITATIONS: interpersonal relationship, driving, and community activity  PERSONAL FACTORS: Age, Time since onset of injury/illness/exacerbation, and 1 comorbidity: HTN  are also affecting patient's functional outcome.   REHAB POTENTIAL: Excellent  CLINICAL DECISION MAKING: Stable/uncomplicated  EVALUATION COMPLEXITY: Low  GOALS: Goals reviewed with patient? Yes  SHORT TERM GOALS: Target date: 08/27/2022  Pt will be independent with initial HEP.  Baseline: Goal status: MET  08/22/22    2.  Pt will report a decrease in pain while sitting and driving by [id number] to improve ADLs and community involvement. Baseline: see NPS above Goal status: MET  08/26/22 - 60-70% improvement  3.  Pt will report a decrease in the N/T along the left arm to decrease pain and discomfort.  Baseline:  Goal status: MET 08/22/22  LONG TERM GOALS: Target date: 09/10/2022  Pt will be independent with HEP to increase independence and decrease pain.  Baseline:  Goal status: IN PROGRESS  08/29/22 - Met for current HEP  2.  Pt will report a score of </= 1/50 on the NDI to improve community involvement and decrease pain with ADLs.  Baseline:  8/50 Goal status: IN PROGRESS  3.  Pt will report a decrease in pain by 50-75% to improve QOL and increase involvement with his community.  Baseline: see NPS above  Goal status: MET  08/26/22 - 60-70% improvement  4.  Pt will show an increase of cervical  ROM to grossly WNL with extension, right rotation, and right lateral flexion.  Baseline: see ROM chart above Goal status: IN PROGRESS  08/29/22 - ROM improving but R rotation & lateral flexion remain slightly limited as compared to L.  5.  Pt will be able to sit in a chair/drive without increased pain.  Baseline:  Goal status: IN PROGRESS  09/02/22 - able to drive 2 hrs w/o increased pain or numbness/tingling in L forearm/hand but symptoms returned/present while sitting in bleachers at gymnastic meet  6.  Pt will resume normal gym workout without limitations due to pain. Baseline:  Goal status: IN PROGRESS   PLAN: PT FREQUENCY: 2x/week  PT DURATION: 4 weeks  PLANNED INTERVENTIONS: Therapeutic exercises, Therapeutic activity, Neuromuscular re-education, Patient/Family education, Self Care, Joint mobilization, DME instructions, Dry Needling, Electrical stimulation, Spinal mobilization, Taping, Traction, Manual therapy, and Re-evaluation  PLAN FOR NEXT SESSION: goal assessment; HEP review/consolidation as needed; anticipate transition to HEP +/- 30-day hold   Percival Spanish, PT 09/05/2022, 9:45 AM

## 2022-09-09 ENCOUNTER — Encounter (INDEPENDENT_AMBULATORY_CARE_PROVIDER_SITE_OTHER): Payer: Medicare Other | Admitting: Ophthalmology

## 2022-09-09 DIAGNOSIS — H35033 Hypertensive retinopathy, bilateral: Secondary | ICD-10-CM

## 2022-09-09 DIAGNOSIS — H353211 Exudative age-related macular degeneration, right eye, with active choroidal neovascularization: Secondary | ICD-10-CM

## 2022-09-09 DIAGNOSIS — H33301 Unspecified retinal break, right eye: Secondary | ICD-10-CM | POA: Diagnosis not present

## 2022-09-09 DIAGNOSIS — I10 Essential (primary) hypertension: Secondary | ICD-10-CM | POA: Diagnosis not present

## 2022-09-09 DIAGNOSIS — H353121 Nonexudative age-related macular degeneration, left eye, early dry stage: Secondary | ICD-10-CM | POA: Diagnosis not present

## 2022-09-09 DIAGNOSIS — H43813 Vitreous degeneration, bilateral: Secondary | ICD-10-CM | POA: Diagnosis not present

## 2022-09-10 ENCOUNTER — Ambulatory Visit: Payer: Medicare Other | Admitting: Physical Therapy

## 2022-09-10 ENCOUNTER — Encounter: Payer: Self-pay | Admitting: Physical Therapy

## 2022-09-10 DIAGNOSIS — M542 Cervicalgia: Secondary | ICD-10-CM | POA: Diagnosis not present

## 2022-09-10 DIAGNOSIS — M25512 Pain in left shoulder: Secondary | ICD-10-CM | POA: Diagnosis not present

## 2022-09-10 DIAGNOSIS — G8929 Other chronic pain: Secondary | ICD-10-CM

## 2022-09-10 DIAGNOSIS — M5412 Radiculopathy, cervical region: Secondary | ICD-10-CM

## 2022-09-10 DIAGNOSIS — R293 Abnormal posture: Secondary | ICD-10-CM

## 2022-09-10 NOTE — Therapy (Addendum)
OUTPATIENT PHYSICAL THERAPY TREATMENT / DISCHARGE SUMMARY  Progress Note  Reporting Period 08/13/2022 to 09/10/2022   See note below for Objective Data and Assessment of Progress/Goals.      Patient Name: Joseph Mayer MRN: [id number] DOB:Dec 29, 1950, 72 y.o., male Today's Date: 09/10/2022  END OF SESSION:  PT End of Session - 09/10/22 0843     Visit Number 9    Date for PT Re-Evaluation 09/10/22    Authorization Type Medicare & [employer]    PT Start Time 0848    PT Stop Time 0918    PT Time Calculation (min) 30 min    Activity Tolerance Patient tolerated treatment well    Behavior During Therapy [facility] for tasks assessed/performed              Past Medical History:  Diagnosis Date   Diverticulitis    Hypertension    Migraine    Past Surgical History:  Procedure Laterality Date   APPENDECTOMY  1994   BACK SURGERY  2005   Patient Active Problem List   Diagnosis Date Noted   Greater trochanteric pain syndrome of right lower extremity 02/13/2020   Primary osteoarthritis of right knee 01/11/2020   Essential hypertension 09/10/2018    PCP:  Sharlene Dory DO  REFERRING PROVIDER: Sharlene Dory DO  REFERRING DIAG: W09.811B (ICD-10-CM) - Strain of left trapezius muscle, initial encounter   THERAPY DIAG:  Cervicalgia  Radiculopathy, cervical region  Chronic left shoulder pain  Abnormal posture  RATIONALE FOR EVALUATION AND TREATMENT: Rehabilitation  ONSET DATE: a year ago in traps lifting weight with bar   NEXT MD VISIT: none at this time    SUBJECTIVE:                                                                                                                                                                                      SUBJECTIVE STATEMENT: Pt reports he received an eye injection yesterday that required him to be positioned awkwardly in the chair and this irritated his L shoulder blade. Otherwise he has had no pain.  Numbness and tingling has been only occasional and typically very brief in duration - mostly in the L hand but occasionally in the forearm.   PERTINENT HISTORY: HTN, Back sx 2005, Knee OA   PAIN:  Are you having pain? 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 No  LIVING ENVIRONMENT: Lives with: lives with their family and lives with their spouse Lives in: House/apartment Stairs: Yes: External: 1 steps; can reach both Has following equipment at home: None  OCCUPATION: Retired   PLOF: Independent  PATIENT GOALS: get rid of pain, back to normal weight lifting routine    OBJECTIVE: (objective measures completed at initial evaluation unless otherwise dated)   DIAGNOSTIC FINDINGS:  N/A   PATIENT SURVEYS :  NDI 8/50 ; 1/50 = 2.0% (09/10/22)  COGNITION: Overall cognitive status: Within functional limits for tasks assessed     SENSATION: N/T on middle of L arm & hand   POSTURE: Rounded shoulders, slumped sitting posture  CERVICAL ROM:   Active ROM AROM (deg) eval AROM 08/29/22 AROM 09/10/22  Flexion WNL WNL WNL  Extension 35 46 48  Right lateral flexion 25 36 37  Left lateral flexion WNL:45 39 40  Right rotation 48 58 60  Left rotation WNL 65 65   (Blank rows = not tested)  CERVICAL MMT: GROSSLY 5/5 FOR ALL   UPPER EXTREMITY ROM: GROSSLY WNL   UPPER EXTREMITY MMT: GROSSLY 5/5 FOR ALL  CERVICAL SPECIAL TESTS:  Upper limb tension test (ULTT): Negative, Spurling's test: Negative, and Distraction test: Positive  SHOULDER SPECIAL TESTS: SLAP lesions: Biceps load test: negative  JOINT MOBILITY TESTING:  08/15/22 - C7/C6 slight  tenderness around the area but seems to be more of the musculature tenderness rather than joint. Cervical spine generally slightly stiff but pt denies any pain or discomfort.   PALPATION:  Slight tenderness on L trap     TODAY'S TREATMENT:                                                                                                                                         DATE:   09/10/22 THERAPEUTIC EXERCISE: to improve flexibility, strength and mobility.  Verbal and tactile cues throughout for technique.  UBE - L4.0 x 6 min (3' each fwd and back) Verbal review of HEP and subsequent progression - Pt denying need for updated handouts or further practice of any exercises other than standing version of open book stretch Verbal review of gym workouts - pt reporting mostly free weight workouts Standing open book stretch at wall 10 x 5" bil  THERAPEUTIC ACTIVITIES: NDI: 1 / 50 = 2.0% Cervical ROM assessment   09/05/22 THERAPEUTIC EXERCISE: to improve flexibility, strength and mobility.  Verbal and tactile cues throughout for technique.  NuStep - L5 x 6 min S/L L shoulder ER + scap retraction 3# 2 x 10 S/L L shoulder hoirz ABD + scap retraction 3#  2 x 10 S/L L shoulder flexion to 90 + scap retraction 3# 2 x 10 Seated RTB cervical retraction 10 x 5", 2 sets Serratus 3-way wall clocks with looped RTB at wrist x 10 bil Serratus roll-ups with 6" FR  on wall 2 x 10 with looped RTB isometric at forearms/wrists  MANUAL THERAPY: To promote normalized muscle tension, improved flexibility, reduced pain, and reduction in radicular symptoms .  STM and manual TPR to L teres group, lats, rhomboids, traps and subscapularis L scapular mobs - all directions - emphasis on scap retraction and depression   09/02/22 THERAPEUTIC EXERCISE: to improve flexibility, strength and mobility.  Verbal and tactile cues throughout for technique.  UBE - L3.0 x 6 min (3' each fwd and back) Seated L/R UT stretch +  slight overpressure 2 x 30" Seated L/R LS stretch + slight overpressure 2 x 30" Seated L/R scalene stretch + strap to depress shoulder 2 x 30" Seated L/R cervical rotation SNAGs 10 x 3" Seated cervical extension SNAGs 10 x 3" Standing blue TB scap retraction + B shoulder extension 10 x 3", 2 sets Standing  blue TB scap retraction + B shoulder horiz ABD 10 x 3", back along doorframe to promote scap retraction Standing blue TB scap retraction + B shoulder ER 10 x 3", back along doorframe to promote scap retraction Standing blue TB scap retraction + B shoulder horiz ABD diagonals 10 x 3", back along doorframe to promote scap retraction  MANUAL THERAPY: To promote normalized muscle tension, improved flexibility, reduced pain, and reduce radicular L UE numbness . Skilled palpation and monitoring of soft tissue during DN Trigger Point Dry-Needling  Treatment instructions: Expect mild to moderate muscle soreness. S/S of pneumothorax if dry needled over a lung field, and to seek immediate medical attention should they occur. Patient verbalized understanding of these instructions and education. Patient Consent Given: Yes Education handout provided: Previously provided Muscles treated: L UT, LS Electrical stimulation performed: No Parameters: N/A Treatment response/outcome: Twitch Response Elicited and Palpable Increase in Muscle Length STM/DTM, manual TPR and pin & stretch to muscles addressed with DN   PATIENT EDUCATION: Education details: HEP progression - TB advanced to blue TB  09/02/22 Person educated: Patient Education method: Explanation Education comprehension: verbalized understanding  HOME EXERCISE PROGRAM: Access Code: [email or web address] URL: [email or web address] Date: 08/29/2022 Prepared by: Glenetta Hew  Exercises - Supine Chin Tuck  - 1 x daily - 7 x weekly - 2 sets - 10 reps - 5 sec hold - Cervical Extension AROM with Strap  - 2 x daily - 7 x weekly - 2 sets - 10 reps -  3-5 sec hold - Mid-Lower Cervical Extension SNAG with Strap  - 2 x daily - 7 x weekly - 2 sets - 10 reps - 3-5 sec hold - Seated Upper Trapezius Stretch (Mirrored)  - 2 x daily - 7 x weekly - 3 reps - 30 sec hold - Gentle Levator Scapulae Stretch  - 2 x daily - 7 x weekly - 3 reps - 30 sec hold - Scapular Retraction with Resistance Advanced  - 2 x daily - 7 x weekly - 2 sets - 10 reps - 5 sec hold - Seated Scalene Stretch with Towel (Mirrored)  - 2 x daily - 7 x weekly - 3 reps - 30 sec hold - Supine Shoulder Horizontal Abduction with Resistance  - 1 x daily - 7 x weekly - 2 sets - 10 reps - 3 sec hold - Supine Shoulder External Rotation on Foam Roll with Theraband  - 1 x daily - 7 x weekly - 2 sets - 10 reps - 3 sec hold - Standing Shoulder Diagonal Horizontal Abduction 60/120 Degrees with Resistance  - 1 x daily -  7 x weekly - 2 sets - 10 reps - 3 sec hold - Sidelying Thoracic Rotation with Open Book (Mirrored)  - 1 x daily - 7 x weekly - 10 reps - 5 sec hold - Prone Shoulder Extension on Swiss Ball  - 1 x daily - 7 x weekly - 2 sets - 10 reps - 3 sec hold - Prone Middle Trapezius Strengthening on Swiss Ball  - 1 x daily - 7 x weekly - 2 sets - 10 reps - 3 sec hold - Prone Lower Trapezius Strengthening on Swiss Ball  - 1 x daily - 7 x weekly - 2 sets - 10 reps - 3 sec hold - Prone Shoulder W on [employer]  - 1 x daily - 7 x weekly - 2 sets - 10 reps - 3 sec hold - Seated Assisted Cervical Rotation with Towel  - 1 x daily - 7 x weekly - 2 sets - 10 reps - 3 sec hold - Theracane Over Shoulder  - 1-2 x daily - 7 x weekly - 1-2 min hold  Patient Education - Trigger Point Dry Needling - Brachial plexus nerve glide   ASSESSMENT:  CLINICAL IMPRESSION: Lum "Joseph Mayer" reports 90% improvement in his overall pain and the L UE radicular symptoms, stating he feels like he is getting back to normal. NDI decreased to 1/50 - 2% disability. Pain mostly non-existent and radicular pain/numbness much less  common, typically only very brief when it does occur with Joseph Mayer reporting he is typically able to get relief of symptoms with postural correction and scapular muscle engagement. Cervical ROM now WNL w/o provocation of pain or radicular symptoms. All goals now met and Rabon "Luan Pulling" feels ready to transition to his HEP but would like to remain on hold for 30-days in the event that issues arise that would necessitate a return to PT.   OBJECTIVE IMPAIRMENTS: decreased knowledge of condition, decreased ROM, hypomobility, increased muscle spasms, impaired flexibility, impaired UE functional use, improper body mechanics, postural dysfunction, and pain.   ACTIVITY LIMITATIONS: lifting, sitting, sleeping, and locomotion level  PARTICIPATION LIMITATIONS: interpersonal relationship, driving, and community activity  PERSONAL FACTORS: Age, Time since onset of injury/illness/exacerbation, and 1 comorbidity: HTN  are also affecting patient's functional outcome.   REHAB POTENTIAL: Excellent  CLINICAL DECISION MAKING: Stable/uncomplicated  EVALUATION COMPLEXITY: Low  GOALS: Goals reviewed with patient? Yes  SHORT TERM GOALS: Target date: 08/27/2022  Pt will be independent with initial HEP.  Baseline: Goal status: MET  08/22/22    2.  Pt will report a decrease in pain while sitting and driving by 16-10% to improve ADLs and community involvement. Baseline: see NPS above Goal status: MET  08/26/22 - 60-70% improvement  3.  Pt will report a decrease in the N/T along the left arm to decrease pain and discomfort.  Baseline:  Goal status: MET 08/22/22  LONG TERM GOALS: Target date: 09/10/2022  Pt will be independent with HEP to increase independence and decrease pain.  Baseline:  Goal status: MET  09/10/22   2.  Pt will report a score of </= 1/50 on the NDI to improve community involvement and decrease pain with ADLs.  Baseline: 8/50 Goal status: MET  09/10/22 - NDI: 1/50   3.  Pt will report a decrease in  pain by 50-75% to improve QOL and increase involvement with his community.  Baseline: see NPS above  Goal status: MET  08/26/22 - 60-70% improvement; 09/10/22 - 90% improvement   4.  Pt will show an increase of cervical ROM to grossly WNL with extension, right rotation, and right lateral flexion.  Baseline: see ROM chart above Goal status: MET  09/13/22  5.  Pt will be able to sit in a chair/drive without increased pain.  Baseline:  Goal status: MET  09/10/22 - pt reports no issues with sitting in a chair and has found that tilting the car seat slightly back has helped with driving  6.  Pt will resume normal gym workout without limitations due to pain. Baseline:  Goal status: MET  09/10/22   PLAN: PT FREQUENCY: 2x/week  PT DURATION: 4 weeks  PLANNED INTERVENTIONS: Therapeutic exercises, Therapeutic activity, Neuromuscular re-education, Patient/Family education, Self Care, Joint mobilization, DME instructions, Dry Needling, Electrical stimulation, Spinal mobilization, Taping, Traction, Manual therapy, and Re-evaluation  PLAN FOR NEXT SESSION: transition to HEP + 30-day hold   Marry Guan, PT 09/10/2022, 9:50 AM   PHYSICAL THERAPY DISCHARGE SUMMARY  Visits from Start of Care: 9  Current functional level related to goals / functional outcomes:   Refer to above clinical impression and goal assessment for status as of last visit on 09/10/2022. Patient was placed on hold for 30 days and has not needed to return to PT, therefore will proceed with discharge from PT for this episode.     Remaining deficits:   As above.    Education / Equipment:   HEP   Patient agrees to discharge. Patient goals were met. Patient is being discharged due to meeting the stated rehab goals.  Marry Guan, PT 12/04/22, 8:19 AM  [facility] [address]  [address] [place], [place], [address] Phone: [phone]   Fax:  [phone]

## 2022-11-03 ENCOUNTER — Encounter: Payer: Self-pay | Admitting: *Deleted

## 2023-01-06 ENCOUNTER — Encounter (INDEPENDENT_AMBULATORY_CARE_PROVIDER_SITE_OTHER): Payer: Medicare Other | Admitting: Ophthalmology

## 2023-01-06 DIAGNOSIS — H33301 Unspecified retinal break, right eye: Secondary | ICD-10-CM

## 2023-01-06 DIAGNOSIS — H35033 Hypertensive retinopathy, bilateral: Secondary | ICD-10-CM | POA: Diagnosis not present

## 2023-01-06 DIAGNOSIS — H353211 Exudative age-related macular degeneration, right eye, with active choroidal neovascularization: Secondary | ICD-10-CM | POA: Diagnosis not present

## 2023-01-06 DIAGNOSIS — H43813 Vitreous degeneration, bilateral: Secondary | ICD-10-CM

## 2023-01-06 DIAGNOSIS — I10 Essential (primary) hypertension: Secondary | ICD-10-CM

## 2023-01-06 DIAGNOSIS — H353121 Nonexudative age-related macular degeneration, left eye, early dry stage: Secondary | ICD-10-CM

## 2023-05-10 ENCOUNTER — Other Ambulatory Visit: Payer: Self-pay | Admitting: Family Medicine

## 2023-05-12 ENCOUNTER — Encounter (INDEPENDENT_AMBULATORY_CARE_PROVIDER_SITE_OTHER): Payer: Medicare Other | Admitting: Ophthalmology

## 2023-05-12 DIAGNOSIS — H33301 Unspecified retinal break, right eye: Secondary | ICD-10-CM | POA: Diagnosis not present

## 2023-05-12 DIAGNOSIS — H35033 Hypertensive retinopathy, bilateral: Secondary | ICD-10-CM | POA: Diagnosis not present

## 2023-05-12 DIAGNOSIS — H353211 Exudative age-related macular degeneration, right eye, with active choroidal neovascularization: Secondary | ICD-10-CM

## 2023-05-12 DIAGNOSIS — I10 Essential (primary) hypertension: Secondary | ICD-10-CM

## 2023-05-12 DIAGNOSIS — H43813 Vitreous degeneration, bilateral: Secondary | ICD-10-CM

## 2023-05-12 DIAGNOSIS — H353121 Nonexudative age-related macular degeneration, left eye, early dry stage: Secondary | ICD-10-CM

## 2023-05-26 DIAGNOSIS — H40051 Ocular hypertension, right eye: Secondary | ICD-10-CM | POA: Diagnosis not present

## 2023-05-26 DIAGNOSIS — H353121 Nonexudative age-related macular degeneration, left eye, early dry stage: Secondary | ICD-10-CM | POA: Diagnosis not present

## 2023-05-26 DIAGNOSIS — H353211 Exudative age-related macular degeneration, right eye, with active choroidal neovascularization: Secondary | ICD-10-CM | POA: Diagnosis not present

## 2023-05-26 DIAGNOSIS — H524 Presbyopia: Secondary | ICD-10-CM | POA: Diagnosis not present

## 2023-07-12 DIAGNOSIS — Z23 Encounter for immunization: Secondary | ICD-10-CM | POA: Diagnosis not present

## 2023-09-15 ENCOUNTER — Encounter (INDEPENDENT_AMBULATORY_CARE_PROVIDER_SITE_OTHER): Payer: Medicare Other | Admitting: Ophthalmology

## 2023-09-15 DIAGNOSIS — I10 Essential (primary) hypertension: Secondary | ICD-10-CM | POA: Diagnosis not present

## 2023-09-15 DIAGNOSIS — H33301 Unspecified retinal break, right eye: Secondary | ICD-10-CM

## 2023-09-15 DIAGNOSIS — H353121 Nonexudative age-related macular degeneration, left eye, early dry stage: Secondary | ICD-10-CM

## 2023-09-15 DIAGNOSIS — H35033 Hypertensive retinopathy, bilateral: Secondary | ICD-10-CM

## 2023-09-15 DIAGNOSIS — H353211 Exudative age-related macular degeneration, right eye, with active choroidal neovascularization: Secondary | ICD-10-CM

## 2023-09-15 DIAGNOSIS — H2513 Age-related nuclear cataract, bilateral: Secondary | ICD-10-CM

## 2023-09-15 DIAGNOSIS — H43813 Vitreous degeneration, bilateral: Secondary | ICD-10-CM

## 2023-12-16 ENCOUNTER — Other Ambulatory Visit: Payer: Self-pay

## 2023-12-16 ENCOUNTER — Encounter: Payer: Self-pay | Admitting: Family Medicine

## 2023-12-16 ENCOUNTER — Ambulatory Visit (INDEPENDENT_AMBULATORY_CARE_PROVIDER_SITE_OTHER): Admitting: Family Medicine

## 2023-12-16 ENCOUNTER — Ambulatory Visit: Payer: Self-pay | Admitting: Family Medicine

## 2023-12-16 VITALS — BP 130/80 | HR 93 | Temp 98.0°F | Resp 16 | Ht 68.0 in | Wt 239.4 lb

## 2023-12-16 DIAGNOSIS — R739 Hyperglycemia, unspecified: Secondary | ICD-10-CM

## 2023-12-16 DIAGNOSIS — I10 Essential (primary) hypertension: Secondary | ICD-10-CM

## 2023-12-16 DIAGNOSIS — Z125 Encounter for screening for malignant neoplasm of prostate: Secondary | ICD-10-CM

## 2023-12-16 DIAGNOSIS — R5383 Other fatigue: Secondary | ICD-10-CM | POA: Diagnosis not present

## 2023-12-16 LAB — CBC
HCT: 43.7 % (ref 39.0–52.0)
Hemoglobin: 14.8 g/dL (ref 13.0–17.0)
MCHC: 33.9 g/dL (ref 30.0–36.0)
MCV: 83.8 fl (ref 78.0–100.0)
Platelets: 360 10*3/uL (ref 150.0–400.0)
RBC: 5.21 Mil/uL (ref 4.22–5.81)
RDW: 14.3 % (ref 11.5–15.5)
WBC: 6.2 10*3/uL (ref 4.0–10.5)

## 2023-12-16 LAB — LIPID PANEL
Cholesterol: 192 mg/dL (ref 0–200)
HDL: 56.2 mg/dL (ref >39.00)
LDL Cholesterol: 118 mg/dL — ABNORMAL HIGH (ref 0–99)
NonHDL: 135.93
Total CHOL/HDL Ratio: 3
Triglycerides: 91 mg/dL (ref 0.0–149.0)
VLDL: 18.2 mg/dL (ref 0.0–40.0)

## 2023-12-16 LAB — COMPREHENSIVE METABOLIC PANEL WITH GFR
ALT: 18 U/L (ref 0–53)
AST: 23 U/L (ref 0–37)
Albumin: 4.3 g/dL (ref 3.5–5.2)
Alkaline Phosphatase: 58 U/L (ref 39–117)
BUN: 27 mg/dL — ABNORMAL HIGH (ref 6–23)
CO2: 32 meq/L (ref 19–32)
Calcium: 9.6 mg/dL (ref 8.4–10.5)
Chloride: 98 meq/L (ref 96–112)
Creatinine, Ser: 1.17 mg/dL (ref 0.40–1.50)
GFR: 63.07 mL/min (ref >60.00)
Glucose, Bld: 107 mg/dL — ABNORMAL HIGH (ref 70–99)
Potassium: 3.8 meq/L (ref 3.5–5.1)
Sodium: 139 meq/L (ref 135–145)
Total Bilirubin: 0.9 mg/dL (ref 0.2–1.2)
Total Protein: 7.8 g/dL (ref 6.0–8.3)

## 2023-12-16 LAB — TSH: TSH: 3.42 u[IU]/mL (ref 0.35–5.50)

## 2023-12-16 LAB — PSA, MEDICARE: PSA: 4.81 ng/mL — ABNORMAL HIGH (ref 0.10–4.00)

## 2023-12-16 NOTE — Progress Notes (Signed)
 Chief Complaint  Patient presents with   Annual Exam    CPE    Subjective Joseph Mayer is a 73 y.o. male who presents for hypertension follow up. He does not monitor home blood pressures. He is compliant with medications - Hyzaar 100-25 mg/d. Patient has these side effects of medication: none He is sometimes adhering to a healthy diet overall. Current exercise: lifting wts, some walking No CP or SOB.   Fatigue- going on for 8-9 mo. Hx of OSA on CPAP but is noncompliant with the mask. He got tired of wearing it. Last sleep study was 15 yrs ago. Will fall asleep in a chair after he eats. Diet/exercise as above. Mood is stable.    Past Medical History:  Diagnosis Date   Diverticulitis    Hypertension    Migraine     Exam BP 130/80 (BP Location: Left Arm, Patient Position: Sitting)   Pulse 93   Temp 98 F (36.7 C) (Oral)   Resp 16   Ht 5\' 8"  (1.727 m)   Wt 239 lb 6.4 oz (108.6 kg)   SpO2 97%   BMI 36.40 kg/m  General:  well developed, well nourished, in no apparent distress Heart: RRR, no bruits, no LE edema Lungs: clear to auscultation, no accessory muscle use Psych: well oriented with normal range of affect and appropriate judgment/insight  Essential hypertension - Plan: CBC, Comprehensive metabolic panel with GFR, Lipid panel  Screening for prostate cancer - Plan: PSA, Medicare ( [employer] only)  Fatigue, unspecified type - Plan: TSH  Chronic, stable. Cont Hyzaar 100-25 mg/d. Counseled on diet and exercise. Screening as above.  Add TSH. Could be related to untreated OSA.  F/u in 6 mo. The patient voiced understanding and agreement to the plan.  Shellie Dials [place], DO 12/16/23  11:14 AM

## 2023-12-16 NOTE — Patient Instructions (Signed)
 Give [place] 2-3 business days to get the results of your labs back.   Keep the diet clean and stay active.  Let us know if you need anything.

## 2023-12-17 ENCOUNTER — Ambulatory Visit (INDEPENDENT_AMBULATORY_CARE_PROVIDER_SITE_OTHER)

## 2023-12-17 ENCOUNTER — Ambulatory Visit: Payer: Self-pay | Admitting: Family Medicine

## 2023-12-17 ENCOUNTER — Encounter: Payer: Self-pay | Admitting: Family Medicine

## 2023-12-17 DIAGNOSIS — R739 Hyperglycemia, unspecified: Secondary | ICD-10-CM

## 2023-12-17 DIAGNOSIS — R7303 Prediabetes: Secondary | ICD-10-CM | POA: Insufficient documentation

## 2023-12-17 LAB — HEMOGLOBIN A1C: Hgb A1c MFr Bld: 5.9 % (ref 4.6–6.5)

## 2024-01-19 ENCOUNTER — Encounter (INDEPENDENT_AMBULATORY_CARE_PROVIDER_SITE_OTHER): Payer: Medicare Other | Admitting: Ophthalmology

## 2024-01-19 DIAGNOSIS — H353121 Nonexudative age-related macular degeneration, left eye, early dry stage: Secondary | ICD-10-CM

## 2024-01-19 DIAGNOSIS — I10 Essential (primary) hypertension: Secondary | ICD-10-CM | POA: Diagnosis not present

## 2024-01-19 DIAGNOSIS — H43813 Vitreous degeneration, bilateral: Secondary | ICD-10-CM | POA: Diagnosis not present

## 2024-01-19 DIAGNOSIS — H33301 Unspecified retinal break, right eye: Secondary | ICD-10-CM | POA: Diagnosis not present

## 2024-01-19 DIAGNOSIS — H2513 Age-related nuclear cataract, bilateral: Secondary | ICD-10-CM | POA: Diagnosis not present

## 2024-01-19 DIAGNOSIS — H35033 Hypertensive retinopathy, bilateral: Secondary | ICD-10-CM | POA: Diagnosis not present

## 2024-01-19 DIAGNOSIS — H353211 Exudative age-related macular degeneration, right eye, with active choroidal neovascularization: Secondary | ICD-10-CM

## 2024-01-20 ENCOUNTER — Telehealth: Payer: Self-pay | Admitting: Family Medicine

## 2024-01-20 ENCOUNTER — Other Ambulatory Visit: Payer: Self-pay

## 2024-01-20 DIAGNOSIS — Z125 Encounter for screening for malignant neoplasm of prostate: Secondary | ICD-10-CM

## 2024-01-20 NOTE — Telephone Encounter (Signed)
 Pt needs lab orders for appointment on the 7/7

## 2024-01-25 ENCOUNTER — Other Ambulatory Visit: Payer: Self-pay | Admitting: Family Medicine

## 2024-01-25 ENCOUNTER — Ambulatory Visit: Payer: Self-pay | Admitting: Family Medicine

## 2024-01-25 ENCOUNTER — Other Ambulatory Visit (INDEPENDENT_AMBULATORY_CARE_PROVIDER_SITE_OTHER)

## 2024-01-25 DIAGNOSIS — R972 Elevated prostate specific antigen [PSA]: Secondary | ICD-10-CM

## 2024-01-25 DIAGNOSIS — Z125 Encounter for screening for malignant neoplasm of prostate: Secondary | ICD-10-CM

## 2024-01-25 LAB — PSA, MEDICARE: PSA: 5.29 ng/mL — ABNORMAL HIGH (ref 0.10–4.00)

## 2024-01-27 ENCOUNTER — Other Ambulatory Visit: Payer: Self-pay | Admitting: Family Medicine

## 2024-02-24 ENCOUNTER — Telehealth: Payer: Self-pay | Admitting: Family Medicine

## 2024-02-24 NOTE — Telephone Encounter (Signed)
 Copied from CRM #[id number]. Topic: Medicare AWV >> Feb 24, 2024  1:30 PM Nathanel DEL wrote: Reason for CRM: Called LVM 02/24/2024 to schedule AWV. Please schedule Virtual or Telehealth visits ONLY  Nathanel Paschal; Care Guide Ambulatory Clinical Support [place] [facility] Direct Dial: [phone]

## 2024-03-23 ENCOUNTER — Ambulatory Visit: Admitting: Urology

## 2024-03-23 ENCOUNTER — Encounter: Payer: Self-pay | Admitting: Urology

## 2024-03-23 VITALS — BP 188/115 | HR 81 | Ht 67.0 in | Wt 230.0 lb

## 2024-03-23 DIAGNOSIS — R972 Elevated prostate specific antigen [PSA]: Secondary | ICD-10-CM | POA: Diagnosis not present

## 2024-03-23 LAB — URINALYSIS, ROUTINE W REFLEX MICROSCOPIC
Bilirubin, UA: NEGATIVE
Glucose, UA: NEGATIVE
Ketones, UA: NEGATIVE
Leukocytes,UA: NEGATIVE
Nitrite, UA: NEGATIVE
Protein,UA: NEGATIVE
RBC, UA: NEGATIVE
Specific Gravity, UA: 1.015 (ref 1.005–1.030)
Urobilinogen, Ur: 0.2 mg/dL (ref 0.2–1.0)
pH, UA: 6 (ref 5.0–7.5)

## 2024-03-23 NOTE — Progress Notes (Addendum)
 Assessment: 1. Elevated PSA     Plan: Today I had a long discussion with the patient regarding PSA and the rationale and controversies of prostate cancer early detection.  I discussed the pros and cons of further evaluation including TRUS and prostate Bx.  Potential adverse events and complications as well as standard instructions were given.  Patient expressed his understanding of these issues. Schedule for prostate ultrasound and biopsy.   Chief Complaint:  Chief Complaint  Patient presents with   Elevated PSA    History of Present Illness:  Joseph Mayer is a 73 y.o. male who is seen in consultation from Frann Mabel Mt, DO for evaluation of elevated PSA  PSA results: 11/20 3.45 5/25 4.81 7/25 5.29  No prior history of elevated PSA.  No history of UTIs or prostatitis.   No family history of prostate cancer.   He has minimal lower urinary tract symptoms with occasional urgency.  No dysuria or gross hematuria. IPSS = 4/2.   Past Medical History:  Past Medical History:  Diagnosis Date   Diverticulitis    Hypertension    Migraine    Prediabetes     Past Surgical History:  Past Surgical History:  Procedure Laterality Date   APPENDECTOMY  1994   BACK SURGERY  2005    Allergies:  No Known Allergies  Family History:  Family History  Problem Relation Age of Onset   Diabetes Mother    Early death Mother    Heart disease Mother    Stroke Mother    Early death Maternal Grandmother    Arthritis Maternal Grandfather        RA   Early death Paternal Grandfather     Social History:  Social History   Tobacco Use   Smoking status: Never   Smokeless tobacco: Never  Substance Use Topics   Alcohol use: Yes    Comment: rarely   Drug use: Never    Review of symptoms:  Constitutional:  Negative for unexplained weight loss, night sweats, fever, chills ENT:  Negative for nose bleeds, sinus pain, painful swallowing CV:  Negative for chest pain,  shortness of breath, exercise intolerance, palpitations, loss of consciousness Resp:  Negative for cough, wheezing, shortness of breath GI:  Negative for nausea, vomiting, diarrhea, bloody stools GU:  Positives noted in HPI; otherwise negative for gross hematuria, dysuria, urinary incontinence Neuro:  Negative for seizures, poor balance, limb weakness, slurred speech Psych:  Negative for lack of energy, depression, anxiety Endocrine:  Negative for polydipsia, polyuria, symptoms of hypoglycemia (dizziness, hunger, sweating) Hematologic:  Negative for anemia, purpura, petechia, prolonged or excessive bleeding, use of anticoagulants  Allergic:  Negative for difficulty breathing or choking as a result of exposure to anything; no shellfish allergy; no allergic response (rash/itch) to materials, foods  Physical exam: BP (!) 188/115   Pulse 81   Ht 5' 7 (1.702 m)   Wt 230 lb (104.3 kg)   BMI 36.02 kg/m  GENERAL APPEARANCE:  Well appearing, well developed, well nourished, NAD HEENT: Atraumatic, Normocephalic, oropharynx clear. NECK: Supple without lymphadenopathy or thyromegaly. LUNGS: Clear to auscultation bilaterally. HEART: Regular Rate and Rhythm without murmurs, gallops, or rubs. ABDOMEN: Soft, non-tender, No Masses. EXTREMITIES: Moves all extremities well.  Without clubbing, cyanosis, or edema. NEUROLOGIC:  Alert and oriented x 3, normal gait, CN II-XII grossly intact.  MENTAL STATUS:  Appropriate. BACK:  Non-tender to palpation.  No CVAT SKIN:  Warm, dry and intact.   GU: Penis:  circumcised  Meatus: Normal Scrotum: normal, no masses Testis: normal without masses bilateral Prostate: 40 g, NT, no nodules Rectum: Normal tone,  no masses or tenderness   Results: U/A:  negative

## 2024-03-25 ENCOUNTER — Encounter (INDEPENDENT_AMBULATORY_CARE_PROVIDER_SITE_OTHER): Admitting: Ophthalmology

## 2024-03-25 DIAGNOSIS — H353211 Exudative age-related macular degeneration, right eye, with active choroidal neovascularization: Secondary | ICD-10-CM | POA: Diagnosis not present

## 2024-03-25 DIAGNOSIS — I10 Essential (primary) hypertension: Secondary | ICD-10-CM | POA: Diagnosis not present

## 2024-03-25 DIAGNOSIS — H35033 Hypertensive retinopathy, bilateral: Secondary | ICD-10-CM | POA: Diagnosis not present

## 2024-03-25 DIAGNOSIS — H353121 Nonexudative age-related macular degeneration, left eye, early dry stage: Secondary | ICD-10-CM

## 2024-03-25 DIAGNOSIS — H33303 Unspecified retinal break, bilateral: Secondary | ICD-10-CM

## 2024-03-25 DIAGNOSIS — H43813 Vitreous degeneration, bilateral: Secondary | ICD-10-CM

## 2024-04-12 ENCOUNTER — Encounter: Payer: Self-pay | Admitting: Urology

## 2024-04-12 ENCOUNTER — Ambulatory Visit (HOSPITAL_BASED_OUTPATIENT_CLINIC_OR_DEPARTMENT_OTHER)
Admission: RE | Admit: 2024-04-12 | Discharge: 2024-04-12 | Disposition: A | Discharge status: Home / Self Care | Source: Ambulatory Visit | Attending: Urology | Admitting: Urology

## 2024-04-12 ENCOUNTER — Ambulatory Visit (INDEPENDENT_AMBULATORY_CARE_PROVIDER_SITE_OTHER): Admitting: Urology

## 2024-04-12 ENCOUNTER — Other Ambulatory Visit (HOSPITAL_BASED_OUTPATIENT_CLINIC_OR_DEPARTMENT_OTHER): Payer: Self-pay | Admitting: Urology

## 2024-04-12 VITALS — BP 191/99 | HR 85 | Ht 67.0 in | Wt 230.0 lb

## 2024-04-12 DIAGNOSIS — C61 Malignant neoplasm of prostate: Secondary | ICD-10-CM | POA: Diagnosis not present

## 2024-04-12 DIAGNOSIS — Z2989 Encounter for other specified prophylactic measures: Secondary | ICD-10-CM | POA: Diagnosis not present

## 2024-04-12 DIAGNOSIS — R972 Elevated prostate specific antigen [PSA]: Secondary | ICD-10-CM | POA: Diagnosis not present

## 2024-04-12 DIAGNOSIS — N4231 Prostatic intraepithelial neoplasia: Secondary | ICD-10-CM | POA: Diagnosis not present

## 2024-04-12 MED ORDER — CEFTRIAXONE SODIUM 1 G IJ SOLR
1.0000 g | Freq: Once | INTRAMUSCULAR | Status: AC
  Administered 2024-04-12: 1 g via INTRAMUSCULAR

## 2024-04-12 NOTE — Addendum Note (Signed)
 Addended by: OBADIAH ROSELEE RAMAN on: 04/12/2024 09:06 AM   Modules accepted: Orders

## 2024-04-12 NOTE — Progress Notes (Signed)
  Assessment: 1. Elevated PSA      Plan: Post biopsy instructions given Return to office in 7-10 days for biopsy results  Chief Complaint:  Chief Complaint  Patient presents with   Prostate Biopsy    History of Present Illness:  Joseph Mayer is a 73 y.o. male who is seen for further evaluation of elevated PSA  PSA results: 11/20 3.45 5/25 4.81 7/25 5.29  No prior history of elevated PSA.  No history of UTIs or prostatitis.   No family history of prostate cancer.   He has minimal lower urinary tract symptoms with occasional urgency.  No dysuria or gross hematuria. IPSS = 4/2.  He presents today to TRUS/BX.  Portions of the above documentation were copied from a prior visit for review purposes only.  Past Medical History:  Past Medical History:  Diagnosis Date   Diverticulitis    Hypertension    Migraine    Prediabetes     Past Surgical History:  Past Surgical History:  Procedure Laterality Date   APPENDECTOMY  1994   BACK SURGERY  2005    Allergies:  No Known Allergies  Family History:  Family History  Problem Relation Age of Onset   Diabetes Mother    Early death Mother    Heart disease Mother    Stroke Mother    Early death Maternal Grandmother    Arthritis Maternal Grandfather        RA   Early death Paternal Grandfather     Social History:  Social History   Tobacco Use   Smoking status: Never   Smokeless tobacco: Never  Substance Use Topics   Alcohol use: Yes    Comment: rarely   Drug use: Never    ROS: Constitutional:  Negative for fever, chills, weight loss CV: Negative for chest pain, previous MI, hypertension Respiratory:  Negative for shortness of breath, wheezing, sleep apnea, frequent cough GI:  Negative for nausea, vomiting, bloody stool, GERD  Physical exam: BP (!) 191/99   Pulse 85   Ht 5' 7 (1.702 m)   Wt 230 lb (104.3 kg)   BMI 36.02 kg/m  GENERAL APPEARANCE:  Well appearing, well developed, well nourished,  NAD   Results: None  TRANSRECTAL ULTRASOUND AND PROSTATE BIOPSY  Indication:  Elevated PSA  Prophylactic antibiotic administration: Rocephin   All medications that could result in increased bleeding were discontinued within an appropriate period of the time of biopsy.  Risk including bleeding and infection were discussed.  Informed consent was obtained.  The patient was placed in the left lateral decubitus position.  PROCEDURE 1.  TRANSRECTAL ULTRASOUND OF THE PROSTATE  The 7 MHz transrectal probe was used to image the prostate.  Anal stenosis was not noted.  TRUS volume: 49.9 ml  Hypoechoic areas: None  Hyperechoic areas: None  Central calcifications: not present  Margins:  normal  Seminal Vesicles: normal   PROCEDURE 2:  PROSTATE BIOPSY  A periprostatic block was performed using 1% lidocaine and transrectal ultrasound guidance. Under transrectal ultrasound guidance, and using the Biopty gun, prostate biopsies were obtained systematically from the apex, mid gland, and base bilaterally.  A total of 12 cores were obtained.  Hemostasis was obtained with gentle pressure on the prostate.  The procedures were well-tolerated.  No significant bleeding was noted at the end of the procedure.  The patient was stable for discharge from the office.

## 2024-04-12 NOTE — Progress Notes (Signed)
 IM Injection  Patient is present today for an IM Injection for treatment of infection prevention post prostate biopsy Drug: Ceftriaxone Dose:1g Location:Right upper outer buttocks Lot: [id number] Exp:08/2025 Patient tolerated well, no complications were noted  Performed by: Tenisha Fleece CMA

## 2024-04-14 LAB — PROSTATE CORE NEEDLE BIOPSY

## 2024-04-15 ENCOUNTER — Encounter: Payer: Self-pay | Admitting: Urology

## 2024-04-19 ENCOUNTER — Ambulatory Visit: Payer: Self-pay | Admitting: Urology

## 2024-04-20 ENCOUNTER — Ambulatory Visit (INDEPENDENT_AMBULATORY_CARE_PROVIDER_SITE_OTHER): Admitting: Urology

## 2024-04-20 ENCOUNTER — Encounter: Payer: Self-pay | Admitting: Urology

## 2024-04-20 VITALS — BP 184/100 | HR 87 | Ht 67.0 in | Wt 230.0 lb

## 2024-04-20 DIAGNOSIS — C61 Malignant neoplasm of prostate: Secondary | ICD-10-CM

## 2024-04-20 LAB — URINALYSIS, ROUTINE W REFLEX MICROSCOPIC
Bilirubin, UA: NEGATIVE
Glucose, UA: NEGATIVE
Ketones, UA: NEGATIVE
Leukocytes,UA: NEGATIVE
Nitrite, UA: NEGATIVE
Protein,UA: NEGATIVE
Specific Gravity, UA: 1.015 (ref 1.005–1.030)
Urobilinogen, Ur: 0.2 mg/dL (ref 0.2–1.0)
pH, UA: 5.5 (ref 5.0–7.5)

## 2024-04-20 LAB — MICROSCOPIC EXAMINATION: Bacteria, UA: NONE SEEN

## 2024-04-20 NOTE — Progress Notes (Signed)
 Assessment: 1. Prostate cancer (HCC); PSA 5.29; GG 1; low risk disease     Plan: I spent a total of 40 minutes counseling Joseph Mayer regarding the diagnosis of localized prostate cancer.  I spent the first 15 minutes discussing the biopsy results.  Using the [facility] prostate cancer nomogram, I cited a probability of 73 percent for localized prostate cancer, 27 percent of extracapsular extension, 1 percent of seminal vesicle involvement, and 2 percent of lymph node involvement.  I discussed the diagnosis of localized prostate cancer in the natural history of prostate cancer. I then spent the next 25 minutes discussing treatment options for localized prostate cancer.  Specifically, I discussed active surveillance, radical prostatectomy (RALP, RRP, RPP), external beam radiation, low dose rate brachytherapy, cryosurgery, and high intensity frequency ultrasound.  I discussed the risk and benefits of each treatment.  I discussed the potential risk of impotence and incontinence as they relate to treatment of prostate cancer.  Questions were answered.  The patient was given literature regarding prostate cancer to review.  I recommended further evaluation with genomic testing with decipher to further evaluate the patient's low risk prostate cancer and to assist with management decisions including active surveillance. I will contact him with the decipher results when available.  Chief Complaint:  Chief Complaint  Patient presents with   Prostate Cancer    History of Present Illness:  Joseph Mayer is a 73 y.o. male who is seen for discussion of biopsy results showing low risk prostate cancer.  PSA results: 11/20 3.45 5/25 4.81 7/25 5.29  No prior history of elevated PSA.  No history of UTIs or prostatitis.   No family history of prostate cancer.   He has minimal lower urinary tract symptoms with occasional urgency.  No dysuria or gross hematuria. IPSS = 4/2.  Joseph Mayer  returns  today following his transrectal ultrasound and biopsy of the prostate on 04/12/24. PSA: 5.29 ng/ml TRUS volume:  49.9 ml  PSA density:  0.11 Biopsy results:             Gleason score: 3+ 3 = 6            # positive cores: 2/6 on right    2/6 on left            Location of cancer: apex  Complications after biopsy: none Patient without significant LUTS.    IPSS = 5/1 Patient without erectile dysfunction.     Portions of the above documentation were copied from a prior visit for review purposes only.  Past Medical History:  Past Medical History:  Diagnosis Date   Diverticulitis    Hypertension    Migraine    Prediabetes     Past Surgical History:  Past Surgical History:  Procedure Laterality Date   APPENDECTOMY  1994   BACK SURGERY  2005    Allergies:  No Known Allergies  Family History:  Family History  Problem Relation Age of Onset   Diabetes Mother    Early death Mother    Heart disease Mother    Stroke Mother    Early death Maternal Grandmother    Arthritis Maternal Grandfather        RA   Early death Paternal Grandfather     Social History:  Social History   Tobacco Use   Smoking status: Never   Smokeless tobacco: Never  Substance Use Topics   Alcohol use: Yes    Comment: rarely   Drug use: Never  ROS: Constitutional:  Negative for fever, chills, weight loss CV: Negative for chest pain, previous MI, hypertension Respiratory:  Negative for shortness of breath, wheezing, sleep apnea, frequent cough GI:  Negative for nausea, vomiting, bloody stool, GERD  Physical exam: BP (!) 184/100   Pulse 87   Ht 5' 7 (1.702 m)   Wt 230 lb (104.3 kg)   BMI 36.02 kg/m  GENERAL APPEARANCE:  Well appearing, well developed, well nourished, NAD   Results: U/A: 0-5 WBCs, 11-30 RBCs

## 2024-05-14 DIAGNOSIS — C61 Malignant neoplasm of prostate: Secondary | ICD-10-CM | POA: Diagnosis not present

## 2024-05-18 ENCOUNTER — Encounter: Payer: Self-pay | Admitting: Urology

## 2024-05-19 NOTE — Telephone Encounter (Signed)
 Patient called and asked for a return call back. Please advise.

## 2024-05-20 NOTE — Telephone Encounter (Signed)
 Let pt know Dr was out of office until Monday. Pt expressed understanding.

## 2024-05-24 ENCOUNTER — Encounter (INDEPENDENT_AMBULATORY_CARE_PROVIDER_SITE_OTHER): Admitting: Ophthalmology

## 2024-05-24 DIAGNOSIS — H33301 Unspecified retinal break, right eye: Secondary | ICD-10-CM | POA: Diagnosis not present

## 2024-05-24 DIAGNOSIS — I10 Essential (primary) hypertension: Secondary | ICD-10-CM | POA: Diagnosis not present

## 2024-05-24 DIAGNOSIS — H353211 Exudative age-related macular degeneration, right eye, with active choroidal neovascularization: Secondary | ICD-10-CM

## 2024-05-24 DIAGNOSIS — H43813 Vitreous degeneration, bilateral: Secondary | ICD-10-CM

## 2024-05-24 DIAGNOSIS — H35033 Hypertensive retinopathy, bilateral: Secondary | ICD-10-CM | POA: Diagnosis not present

## 2024-05-25 ENCOUNTER — Other Ambulatory Visit: Payer: Self-pay | Admitting: Urology

## 2024-05-25 DIAGNOSIS — C61 Malignant neoplasm of prostate: Secondary | ICD-10-CM

## 2024-05-26 DIAGNOSIS — H25813 Combined forms of age-related cataract, bilateral: Secondary | ICD-10-CM | POA: Diagnosis not present

## 2024-05-26 DIAGNOSIS — H40051 Ocular hypertension, right eye: Secondary | ICD-10-CM | POA: Diagnosis not present

## 2024-05-26 LAB — HM DIABETES EYE EXAM

## 2024-06-27 ENCOUNTER — Ambulatory Visit: Admitting: Family Medicine

## 2024-06-27 ENCOUNTER — Encounter: Payer: Self-pay | Admitting: Family Medicine

## 2024-06-27 VITALS — BP 130/82 | HR 100 | Temp 98.0°F | Resp 16 | Ht 67.0 in | Wt 239.2 lb

## 2024-06-27 DIAGNOSIS — G4733 Obstructive sleep apnea (adult) (pediatric): Secondary | ICD-10-CM

## 2024-06-27 DIAGNOSIS — M65341 Trigger finger, right ring finger: Secondary | ICD-10-CM | POA: Diagnosis not present

## 2024-06-27 MED ORDER — METHYLPREDNISOLONE ACETATE 40 MG/ML IJ SUSP
40.0000 mg | Freq: Once | INTRAMUSCULAR | Status: AC
  Administered 2024-06-27: 40 mg via INTRAMUSCULAR

## 2024-06-27 NOTE — Addendum Note (Signed)
 Addended by: Montavious Wierzba M on: 06/27/2024 11:42 AM   Modules accepted: Orders

## 2024-06-27 NOTE — Progress Notes (Signed)
 Musculoskeletal Exam  Patient: Joseph Mayer DOB: 02/04/1951  DOS: 06/27/2024  SUBJECTIVE:  Chief Complaint:   Chief Complaint  Patient presents with   Hand Pain    Hand Pain    Joseph Mayer is a 73 y.o.  male for evaluation and treatment of b/l hand pain.   Onset:  2 months ago. No inj or change in activity.  Location: L middle finger palm side; R ring finger Character:  sharp  Progression of issue:  has worsened Associated symptoms: locking No bruising, redness, swelling Treatment: to date has been none.   Neurovascular symptoms: no  Patient has a history of OSA requiring CPAP.  He stopped using it 15 years ago.  He has daytime fatigue and snoring.  He is requesting to see a sleep specialist again.  He has politely declined this offer in the past.  Past Medical History:  Diagnosis Date   Diverticulitis    Hypertension    Migraine    Prediabetes     Objective: VITAL SIGNS: BP 130/82 (BP Location: Left Arm, Patient Position: Sitting)   Pulse 100   Temp 98 F (36.7 C) (Oral)   Resp 16   Ht 5' 7 (1.702 m)   Wt 239 lb 3.2 oz (108.5 kg)   SpO2 98%   BMI 37.46 kg/m  Constitutional: Well formed, well developed. No acute distress. Thorax & Lungs: No accessory muscle use Musculoskeletal: R ring finger.   Normal active range of motion: yes.   Normal passive range of motion: yes Tenderness to palpation: yes over distal palmar region 4th digit Deformity: no Ecchymosis: no Neurologic: Normal sensory function.  Psychiatric: Normal mood. Age appropriate judgment and insight. Alert & oriented x 3.    Procedure note: Trigger finger injection Verbal consent obtained. The area of interest was palpated and just distal over the palmar MCP crease, the tendon was marked with an otoscope speculum. The area marked by a speculum was cleaned with alcohol and freeze spray was used for topical anesthesia. A 30-gauge needle was inserted at a 45 degree angle and once under the  skin, continued in parallel fashion to the tendon, and used to inject 20 mg of Depo-Medrol  and 0.5 mg of 1% lidocaine without epinephrine.  The plunger of the syringe was withdrawn prior to injecting the medication to ensure the needle was not in a blood vessel. A Band-Aid was placed. The patient tolerated the procedure well with no immediate complications noted.  Assessment:  Trigger finger, right ring finger - Plan: PR INJECTION 1 TENDON SHEATH/LIGAMENT APONEUROSIS  OSA on CPAP - Plan: Ambulatory referral to Neurology  Plan: Injection today.  Consider frog splint, ice, heat, Tylenol.  Send message in 1 week if no improvement and we will refer him to the hand team. Refer back to the sleep team. F/u as originally scheduled. The patient voiced understanding and agreement to the plan.   Mabel Mt [place], DO 06/27/24  9:43 AM

## 2024-06-27 NOTE — Patient Instructions (Addendum)
 Ice/cold pack over area for 10-15 min twice daily.  OK to take Tylenol 1000 mg (2 extra strength tabs) or 975 mg (3 regular strength tabs) every 6 hours as needed.  Consider a frog splint over the affected fingers.   If you do not hear anything about your referral in the next 1-2 weeks, call our office and ask for an update.  Let us  know if you need anything.

## 2024-06-28 DIAGNOSIS — Z23 Encounter for immunization: Secondary | ICD-10-CM | POA: Diagnosis not present

## 2024-07-05 ENCOUNTER — Encounter (INDEPENDENT_AMBULATORY_CARE_PROVIDER_SITE_OTHER): Admitting: Ophthalmology

## 2024-07-05 DIAGNOSIS — H33301 Unspecified retinal break, right eye: Secondary | ICD-10-CM | POA: Diagnosis not present

## 2024-07-05 DIAGNOSIS — H43813 Vitreous degeneration, bilateral: Secondary | ICD-10-CM | POA: Diagnosis not present

## 2024-07-05 DIAGNOSIS — H35033 Hypertensive retinopathy, bilateral: Secondary | ICD-10-CM | POA: Diagnosis not present

## 2024-07-05 DIAGNOSIS — I10 Essential (primary) hypertension: Secondary | ICD-10-CM | POA: Diagnosis not present

## 2024-07-05 DIAGNOSIS — H353211 Exudative age-related macular degeneration, right eye, with active choroidal neovascularization: Secondary | ICD-10-CM | POA: Diagnosis not present

## 2024-07-26 ENCOUNTER — Ambulatory Visit (HOSPITAL_BASED_OUTPATIENT_CLINIC_OR_DEPARTMENT_OTHER)
Admission: RE | Admit: 2024-07-26 | Discharge: 2024-07-26 | Disposition: A | Discharge status: Home / Self Care | Source: Ambulatory Visit | Attending: Urology | Admitting: Urology

## 2024-07-26 DIAGNOSIS — C61 Malignant neoplasm of prostate: Secondary | ICD-10-CM | POA: Diagnosis present

## 2024-07-26 MED ORDER — GADOBUTROL 1 MMOL/ML IV SOLN
10.0000 mL | Freq: Once | INTRAVENOUS | Status: AC | PRN
  Administered 2024-07-26: 10 mL via INTRAVENOUS

## 2024-08-01 ENCOUNTER — Ambulatory Visit: Payer: Self-pay | Admitting: Urology

## 2024-08-01 ENCOUNTER — Ambulatory Visit: Admitting: Urology

## 2024-08-01 ENCOUNTER — Encounter: Payer: Self-pay | Admitting: Urology

## 2024-08-01 VITALS — BP 178/95 | HR 85 | Ht 67.0 in | Wt 235.0 lb

## 2024-08-01 DIAGNOSIS — C61 Malignant neoplasm of prostate: Secondary | ICD-10-CM | POA: Diagnosis not present

## 2024-08-01 LAB — URINALYSIS, ROUTINE W REFLEX MICROSCOPIC
Bilirubin, UA: NEGATIVE
Glucose, UA: NEGATIVE
Ketones, UA: NEGATIVE
Leukocytes,UA: NEGATIVE
Nitrite, UA: NEGATIVE
Protein,UA: NEGATIVE
RBC, UA: NEGATIVE
Specific Gravity, UA: 1.015 (ref 1.005–1.030)
Urobilinogen, Ur: 0.2 mg/dL (ref 0.2–1.0)
pH, UA: 6.5 (ref 5.0–7.5)

## 2024-08-01 NOTE — Progress Notes (Signed)
 " Assessment: 1. Prostate cancer (HCC); PSA 5.29; GG 1; low risk disease     Plan: PSA today I discussed the results of the MRI with the patient in detail today.  Given the findings of PI-RADS 3, 4, and 5 lesions on the prostate MRI, I recommend further evaluation with a fusion guided biopsy. Will arrange for fusion guided biopsy at [facility].  Chief Complaint:  Chief Complaint  Patient presents with   Prostate Cancer    History of Present Illness:  Joseph Mayer is a 74 y.o. male who is seen for continued management of  low risk prostate cancer.  PSA results: 11/20 3.45 5/25 4.81 7/25 5.29  No prior history of elevated PSA.  No history of UTIs or prostatitis.   No family history of prostate cancer.   He has minimal lower urinary tract symptoms with occasional urgency.  No dysuria or gross hematuria. IPSS = 4/2.  He was evaluated with transrectal ultrasound and biopsy of the prostate on 04/12/24. PSA: 5.29 ng/ml TRUS volume:  49.9 ml  PSA density:  0.11 Biopsy results:             Gleason score: 3+ 3 = 6            # positive cores: 2/6 on right    2/6 on left            Location of cancer: apex  Complications after biopsy: none Decipher:  Intermediate risk  Prostate MRI from 07/26/24: PI-RADS 5 lesion anterior central gland; PI-RADS 3 lesion left peripheral zone; PI-RADS 4 lesion right apical posterior lateral area; prostate volume 60.4 cm; no evidence of transscapular spread, seminal vesicle involvement, or adenopathy.  He returns today for follow-up and to discuss the recent MRI results.  No new urinary symptoms.  No dysuria or gross hematuria.  No bone pain or weight loss. IPSS = 3/2.   Portions of the above documentation were copied from a prior visit for review purposes only.  Past Medical History:  Past Medical History:  Diagnosis Date   Diverticulitis    Hypertension    Migraine    Prediabetes     Past Surgical History:  Past Surgical History:   Procedure Laterality Date   APPENDECTOMY  1994   BACK SURGERY  2005    Allergies:  No Known Allergies  Family History:  Family History  Problem Relation Age of Onset   Diabetes Mother    Early death Mother    Heart disease Mother    Stroke Mother    Early death Maternal Grandmother    Arthritis Maternal Grandfather        RA   Early death Paternal Grandfather     Social History:  Social History   Tobacco Use   Smoking status: Never   Smokeless tobacco: Never  Substance Use Topics   Alcohol use: Yes    Comment: rarely   Drug use: Never    ROS: Constitutional:  Negative for fever, chills, weight loss CV: Negative for chest pain, previous MI, hypertension Respiratory:  Negative for shortness of breath, wheezing, sleep apnea, frequent cough GI:  Negative for nausea, vomiting, bloody stool, GERD  Physical exam: BP (!) 178/95   Pulse 85   Ht 5' 7 (1.702 m)   Wt 235 lb (106.6 kg)   BMI 36.81 kg/m  GENERAL APPEARANCE:  Well appearing, well developed, well nourished, NAD HEENT:  Atraumatic, normocephalic, oropharynx clear NECK:  Supple without lymphadenopathy or thyromegaly ABDOMEN:  Soft, non-tender, no masses EXTREMITIES:  Moves all extremities well, without clubbing, cyanosis, or edema NEUROLOGIC:  Alert and oriented x 3, normal gait, CN II-XII grossly intact MENTAL STATUS:  appropriate BACK:  Non-tender to palpation, No CVAT SKIN:  Warm, dry, and intact   Results: U/A: negative "

## 2024-08-02 ENCOUNTER — Ambulatory Visit: Payer: Self-pay | Admitting: Urology

## 2024-08-02 LAB — PSA: Prostate Specific Ag, Serum: 4.1 ng/mL — ABNORMAL HIGH (ref 0.0–4.0)

## 2024-08-16 ENCOUNTER — Encounter (INDEPENDENT_AMBULATORY_CARE_PROVIDER_SITE_OTHER): Admitting: Ophthalmology

## 2024-08-16 DIAGNOSIS — I10 Essential (primary) hypertension: Secondary | ICD-10-CM | POA: Diagnosis not present

## 2024-08-16 DIAGNOSIS — H35033 Hypertensive retinopathy, bilateral: Secondary | ICD-10-CM | POA: Diagnosis not present

## 2024-08-16 DIAGNOSIS — H353211 Exudative age-related macular degeneration, right eye, with active choroidal neovascularization: Secondary | ICD-10-CM | POA: Diagnosis not present

## 2024-08-16 DIAGNOSIS — H43813 Vitreous degeneration, bilateral: Secondary | ICD-10-CM

## 2024-08-25 ENCOUNTER — Ambulatory Visit (INDEPENDENT_AMBULATORY_CARE_PROVIDER_SITE_OTHER): Admitting: Neurology

## 2024-08-25 ENCOUNTER — Encounter: Payer: Self-pay | Admitting: Neurology

## 2024-08-25 VITALS — BP 159/97 | HR 75 | Ht 67.0 in | Wt 242.0 lb

## 2024-08-25 DIAGNOSIS — G478 Other sleep disorders: Secondary | ICD-10-CM | POA: Insufficient documentation

## 2024-08-25 DIAGNOSIS — Z9189 Other specified personal risk factors, not elsewhere classified: Secondary | ICD-10-CM | POA: Insufficient documentation

## 2024-08-25 DIAGNOSIS — R0683 Snoring: Secondary | ICD-10-CM | POA: Diagnosis not present

## 2024-08-25 NOTE — Progress Notes (Signed)
 "  @[facility]   Provider:  Dedra Gores, MD   Primary Care Physician:  Frann Mabel Mt, DO [address] [place] [address]   Referring Provider: Frann Mabel Mt [place] [address] [place],  [place] [address]        Chief Concern for this Consultation:   Patient presents with     reports he gets about 7 hrs of sleep a night. He does not feel well rested in the morning. Has daytime fatigue. Spouse reported he does snore and stops breathing in sleep. Was diagnosed with OSA around 1992.      HPI: I have the pleasure of meeting with Joseph Mayer , on 08/25/24 , who is a 74 y.o.  male patient,  seen upon a referral by Dr Frann  for a  Sleep Medicine Consultation.   The patient's referral information asked for an evaluation  of non restorative sleep.   Chief concern according to patient:  Joseph Mayer presented with a medical history of  Past Medical History:  Diagnosis Date   Diverticulitis, on diet     Hypertension, on medication     Migraine, improved in retirement, no trigger identified.    Prediabetes-  HBa1c 5.9      Sleep relevant medical/ surgical and symptom history:,  wisdom teet extraction,  wore braces. Irregular teeth. No  Trauma such as TBI/ whiplash, no Autoimmune disorders, no  GERD,  no Thyroid  disease , prostate cancer dx in  September 2025 .The patient had previous sleep evaluations. Was diagnosed with OSA in 1992, and was on CPAP for  10 years.     Family medical history: There are  biological family members affected by Sleep apnea ( /), or Insomnia (/) , by excessive daytime sleepiness (/).     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 He  lives in a private home, in a household with  spouse ,   2 grown children. A daughter here in [place].    The patient currently  used to work in shifts (night/ rotating,) over 30 years ago. . The workplace  involves physical activity, outdoor activity, travel.  Nicotine use: /.  ETOH use: /, 1 beer a month Caffeine intake in form of: Coffee (/), Soft drinks (diet cokes- now cut down), Tea ( diluted iced tea. ) or Energy drinks ( including those containing  taurine ). Caffeine is last consumed in AM .  Exercises regularly in form of lifting weight .  Hobbies :     none  Sleep habits and routines are as follows: The patient's dinner time is around 4.30 PM.   The patient goes to bed at, or close to, 10-10.30  PM. The bedroom is shared with spouse  and is described as cool, quiet, and dark. The patient reports that it takes 1-2 minutes to fall asleep, then continues to sleep until # AM and resume sleep - total 7  hours, no  need to void (Nocturia).   The preferred sleep position is laterally  , with support of 1 pillows, (non- adjustable bed/).  The total estimated sleep time is circa 7 hours.  Dreams are reportedly infrequent/ and can be vivid. Dream enactment has not been reported.   7 AM is the usual week- day rise time. The patient wakes up spontaneously. He  reports rarely feeling refreshed and restored in  the morning, waking with symptoms such as dry mouth.  No sleep paralysis has been experienced.  He has shoulder pain.  Naps in daytime are taken infrequently (there is a desire to nap and opportunity), lasting hours and have no refreshing quality. These do interfere with nocturnal sleep.    Review of Systems: Out of a complete 14 system review, the patient complains of only the following symptoms, and all other reviewed systems are negative.:  Hypersomnia   Snoring, Sleep fragmentation, Nocturia   How likely are you to doze in the following situations: 0 = not likely, 1 = slight chance, 2 = moderate chance, 3 = high chance Sitting and Reading? Watching Television? Sitting inactive in a public place (theater or meeting)? As a passenger in a car for an hour without a break? Lying down in the  afternoon when circumstances permit? Sitting and talking to someone? Sitting quietly after lunch without alcohol? In a car, while stopped for a few minutes in traffic?   Total ESS =7 / 24 points.    FSS endorsed at 28/ 63 points.  GDS 3/ 15   Social History   Socioeconomic History   Marital status: Married    Spouse name: Not on file   Number of children: Not on file   Years of education: Not on file   Highest education level: Not on file  Occupational History   Not on file  Tobacco Use   Smoking status: Never   Smokeless tobacco: Never  Substance and Sexual Activity   Alcohol use: Yes    Comment: rarely   Drug use: Never   Sexual activity: Not on file  Other Topics Concern   Not on file  Social History Narrative   Not on file   Social Drivers of Health   Tobacco Use: Low Risk (08/25/2024)   Patient History    Smoking Tobacco Use: Never    Smokeless Tobacco Use: Never    Passive Exposure: Not on file  Financial Resource Strain: Not on file  Food Insecurity: Not on file  Transportation Needs: Not on file  Physical Activity: Not on file  Stress: Not on file  Social Connections: Not on file  Depression (PHQ2-9): Low Risk (06/27/2024)   Depression (PHQ2-9)    PHQ-2 Score: 4  Alcohol Screen: Not on file  Housing: Not on file  Utilities: Not on file  Health Literacy: Not on file    Family History  Problem Relation Age of Onset   Diabetes Mother    Early death Mother    Heart disease Mother    Stroke Mother    Early death Maternal Grandmother    Arthritis Maternal Grandfather        RA   Early death Paternal Grandfather     Past Medical History:  Diagnosis Date   Diverticulitis    Hypertension    Migraine    Prediabetes     Past Surgical History:  Procedure Laterality Date   APPENDECTOMY  8   BACK SURGERY  2005     Medications Ordered Prior to Encounter[1]  Allergies[2]  Vitals:   08/25/24 0855 08/25/24 0858  BP: (!) 167/103 (!) 159/97   Pulse:  75      Physical exam:   General: The patient was alert and appears not in acute distress.  Mood and affect are appropriate .  The patient's interactions are: Cooperative, makes eye contact, follows the instructions and answers questions coherently.  The patient is groomed and  appropriately groomed and dressed. Head: Normocephalic, atraumatic.  Neck is supple. Mallampati:  3 plus  .  The neck circumference measured 17 inches. Nasal airflow was patent ,   Overbite / Retrognathia was noted.  Dental status:  Cardiovascular:  Regular rate and cardiac rhythm by palpable pulse. Respiratory: no audible wheezing, no tachypnoea.   Skin:  Without evidence of ankle edema. No discoloration.  Trunk:  BMI is 38 The patient's posture was erect.   Neurologic exam : The patient was awake and alert, oriented to place and time.   Attention span & concentration ability appeared normal.  Speech was fluent, without dysarthria, dysphonia or aphasia, and of normal volume.     Cranial nerves:  There was no loss of smell or taste reported  Pupils are round, equal in size and briskly reactive to light.  Funduscopic exam was deferred.  Extraocular movements in vertical and horizontal planes were intact and without nystagmus. (No Diplopia reported). Visual fields by finger perimetry are intact. Hearing was intact to soft voice.    Facial sensation intact to fine touch.  Facial motor strength: Symmetric movement and tongue and uvula move midline.  Neck ROM: rotation, tilt and flexion extension were intact for age and shoulder shrug was symmetrical.    Motor exam:  Symmetric bulk, strength and ROM.   Normal tone without cog- wheeling, and symmetric grip strength.   Sensory:  Fine touch and vibration were tested by tuning fork and intact.  Proprioception tested in the upper extremities was normal.   Coordination: The patient reported no problems with button closure and no changes to  penmanship.   The Finger-to-nose maneuver was intact without evidence of ataxia, dysmetria or tremor.   Gait and station: Patient could rise unassisted from a seated position, without bracing, and walked without assistive device.  Stance was of normal/ wide width. The patient turned with 3 steps.  Deep tendon reflexes: Upper extremities did show symmetric DTRs. L     I would like to thank Frann Mabel Mt, DO for allowing me to meet with Joseph Senger,  In short, he is presenting with non restorative sleep , but has an overall sleep time of 7 hours. He has no nocturia, no vivid dreams and is not excessively daytime sleepy, he is a very loud snorer, by family reports.   Risk factors for OSA were present,  including : Body mass index is 37.9 kg/m., neck size  17 , irregular  dental status and upper airway anatomy.  He may have vitamin D or B 12 deficiency and  due to recent  prostate cancer dx is not a candidate for testosterone .   My Plan is to proceed with:  HST/ PSG   I ordered both,  the patient has reported a history of severe OSA and needs prescreening  for this condition.    I plan to follow up personally or through our NP within 4-6 months.   A total time of  45  minutes consistent of a part of face to face encounter , exam and interview,  and additional preparation time for chart review was spent .  At today's visit, we discussed treatment options, associated risk and benefits, and engage in counseling as needed including, but not limited to:  Sleep hygiene, Quality Sleep Habits, and Safety concerns for patients with daytime sleepiness who are warned to not operate machinery/ motor vehicles when drowsy. Risk factors for sleep apnea were identified:  Body mass index is 37.9  kg/m..  Additionally, the following were reviewed: Past medical records, past medical and surgical history, family and social background, as well as relevant laboratory results, imaging findings, and  medical notes, where applicable.  This note was generated by myself in part by using dictation software, and as a result, it may contain unintentional typos and errors.  Nevertheless, effort was made to accurately convey the pertinent aspects of the patient's visit.   Dedra Gores, MD   [facility] and [facility] Sleep Board certified in Sleep Medicine by [employer] and Diplomate of the [employer] (AASM) . Board certified In Neurology, Diplomat of the ABPN,  Fellow of the [employer] Neu[employer].          [1]  Current Outpatient Medications on File Prior to Visit  Medication Sig Dispense Refill   losartan -hydrochlorothiazide  (HYZAAR) 100-25 MG tablet TAKE 1 TABLET BY MOUTH EVERY DAY 90 tablet 2   Multiple Vitamins-Minerals (PRESERVISION AREDS 2 PO) Take by mouth.     No current facility-administered medications on file prior to visit.  [2] No Known Allergies  "

## 2024-08-25 NOTE — Patient Instructions (Signed)
" °  I would like to thank Frann Mabel Mt, DO for allowing me to meet with Joseph Mayer,  In short, he is presenting with non restorative sleep , but has an overall sleep time of 7 hours. He has no nocturia, no vivid dreams and is not excessively daytime sleepy, he is a very loud snorer, by family reports.    Risk factors for OSA were present,  including : Body mass index is 37.9 kg/m., neck size  17 , irregular  dental status and upper airway anatomy.   He may have vitamin D or B 12 deficiency and  due to recent  prostate cancer dx is not a candidate for testosterone .    My Plan is to proceed with:   HST/ PSG     I ordered both,  the patient has reported a history of severe OSA and needs prescreening  for this condition.      I plan to follow up personally or through our NP within 4-6 months.    A total time of  45  minutes consistent of a part of face to face encounter , exam and interview,  and additional preparation time for chart review was spent .  At today's visit, we discussed treatment options, associated risk and benefits, and engage in counseling as needed including, but not limited to:  Sleep hygiene, Quality Sleep Habits, and Safety concerns for patients with daytime sleepiness who are warned to not operate machinery/ motor vehicles when drowsy. Risk factors for sleep apnea were identified:  Body mass index is 37.9 kg/m..  Additionally, the following were reviewed: Past medical records, past medical and surgical history, family and social background, as well as relevant laboratory results, imaging findings, and medical notes, where applicable.  This note was generated by myself in part by using dictation software, and as a result, it may contain unintentional typos and errors.  Nevertheless, effort was made to accurately convey the pertinent aspects of the patient's visit.    Dedra Gores, MD    [facility] and [facility] Sleep Board certified in  Sleep Medicine by [employer] Medicine and Diplomate of the [employer] Sleep "

## 2024-09-20 ENCOUNTER — Encounter (INDEPENDENT_AMBULATORY_CARE_PROVIDER_SITE_OTHER): Admitting: Ophthalmology
